# Patient Record
Sex: Male | Born: 1955 | Race: White | Hispanic: No | Marital: Married | Smoking: Never smoker
Health system: Southern US, Community
[De-identification: ages and names within clinical notes are randomized; demographics above are authoritative.]

## PROBLEM LIST (undated history)

## (undated) DIAGNOSIS — E785 Hyperlipidemia, unspecified: Secondary | ICD-10-CM

## (undated) DIAGNOSIS — B009 Herpesviral infection, unspecified: Secondary | ICD-10-CM

## (undated) DIAGNOSIS — I2699 Other pulmonary embolism without acute cor pulmonale: Secondary | ICD-10-CM

## (undated) DIAGNOSIS — F329 Major depressive disorder, single episode, unspecified: Secondary | ICD-10-CM

## (undated) DIAGNOSIS — I82409 Acute embolism and thrombosis of unspecified deep veins of unspecified lower extremity: Secondary | ICD-10-CM

## (undated) DIAGNOSIS — I10 Essential (primary) hypertension: Secondary | ICD-10-CM

## (undated) DIAGNOSIS — E079 Disorder of thyroid, unspecified: Secondary | ICD-10-CM

## (undated) DIAGNOSIS — F32A Depression, unspecified: Secondary | ICD-10-CM

## (undated) DIAGNOSIS — I219 Acute myocardial infarction, unspecified: Secondary | ICD-10-CM

## (undated) DIAGNOSIS — D689 Coagulation defect, unspecified: Secondary | ICD-10-CM

## (undated) DIAGNOSIS — I2510 Atherosclerotic heart disease of native coronary artery without angina pectoris: Secondary | ICD-10-CM

## (undated) HISTORY — DX: Depression, unspecified: F32.A

## (undated) HISTORY — DX: Other pulmonary embolism without acute cor pulmonale: I26.99

## (undated) HISTORY — DX: Disorder of thyroid, unspecified: E07.9

## (undated) HISTORY — DX: Coagulation defect, unspecified: D68.9

## (undated) HISTORY — PX: COSMETIC SURGERY: SHX468

## (undated) HISTORY — DX: Herpesviral infection, unspecified: B00.9

## (undated) HISTORY — DX: Essential (primary) hypertension: I10

## (undated) HISTORY — PX: VASECTOMY: SHX75

## (undated) HISTORY — DX: Hyperlipidemia, unspecified: E78.5

## (undated) HISTORY — DX: Acute myocardial infarction, unspecified: I21.9

## (undated) HISTORY — DX: Major depressive disorder, single episode, unspecified: F32.9

## (undated) HISTORY — DX: Acute embolism and thrombosis of unspecified deep veins of unspecified lower extremity: I82.409

## (undated) HISTORY — PX: THYROID SURGERY: SHX805

## (undated) HISTORY — PX: TONSILLECTOMY: SUR1361

## (undated) HISTORY — DX: Atherosclerotic heart disease of native coronary artery without angina pectoris: I25.10

---

## 1996-01-27 DIAGNOSIS — I2699 Other pulmonary embolism without acute cor pulmonale: Secondary | ICD-10-CM

## 1996-01-27 HISTORY — DX: Other pulmonary embolism without acute cor pulmonale: I26.99

## 1997-09-20 ENCOUNTER — Ambulatory Visit (HOSPITAL_COMMUNITY): Admission: RE | Admit: 1997-09-20 | Discharge: 1997-09-20 | Payer: Self-pay | Admitting: Internal Medicine

## 1997-09-20 ENCOUNTER — Inpatient Hospital Stay (HOSPITAL_COMMUNITY): Admission: EM | Admit: 1997-09-20 | Discharge: 1997-09-25 | Payer: Self-pay | Admitting: Internal Medicine

## 2001-08-30 ENCOUNTER — Other Ambulatory Visit: Admission: RE | Admit: 2001-08-30 | Discharge: 2001-08-30 | Payer: Self-pay | Admitting: Diagnostic Radiology

## 2001-10-10 ENCOUNTER — Encounter (INDEPENDENT_AMBULATORY_CARE_PROVIDER_SITE_OTHER): Payer: Self-pay

## 2001-10-10 ENCOUNTER — Observation Stay (HOSPITAL_COMMUNITY): Admission: RE | Admit: 2001-10-10 | Discharge: 2001-10-11 | Payer: Self-pay | Admitting: Surgery

## 2004-04-24 ENCOUNTER — Ambulatory Visit: Payer: Self-pay | Admitting: Psychology

## 2004-05-06 ENCOUNTER — Ambulatory Visit: Payer: Self-pay | Admitting: Psychology

## 2004-05-16 ENCOUNTER — Ambulatory Visit: Payer: Self-pay | Admitting: Psychology

## 2004-05-22 ENCOUNTER — Ambulatory Visit: Payer: Self-pay | Admitting: Psychology

## 2004-05-23 ENCOUNTER — Ambulatory Visit: Payer: Self-pay | Admitting: Psychology

## 2004-05-30 ENCOUNTER — Ambulatory Visit: Payer: Self-pay | Admitting: Psychology

## 2004-06-06 ENCOUNTER — Ambulatory Visit: Payer: Self-pay | Admitting: Psychology

## 2004-06-13 ENCOUNTER — Ambulatory Visit: Payer: Self-pay | Admitting: Psychology

## 2008-09-24 ENCOUNTER — Ambulatory Visit: Payer: Self-pay | Admitting: Vascular Surgery

## 2009-07-04 ENCOUNTER — Encounter: Admission: RE | Admit: 2009-07-04 | Discharge: 2009-07-04 | Payer: Self-pay | Admitting: Family Medicine

## 2010-06-10 NOTE — Procedures (Signed)
DUPLEX DEEP VENOUS EXAM - LOWER EXTREMITY   INDICATION:  Right lower extremity pain and swelling with recent plane  flight   HISTORY:  Edema:  Yes  Trauma/Surgery:  No  Pain:  Yes  PE:  Yes  Previous DVT:  Yes  Anticoagulants:  Other:   DUPLEX EXAM:                CFV   SFV   PopV  PTV    GSV                R  L  R  L  R  L  R   L  R  L  Thrombosis    o  o  o     o     +      o  Spontaneous   +  +  +     +     0      +  Phasic        +  +  +     +     0      +  Augmentation  +  +  +     +     0      +  Compressible  +  +  +     +     0      +  Competent     +  +  +     +     0      +   Legend:  + - yes  o - no  p - partial  D - decreased   IMPRESSION:  Acute deep venous thrombosis noted in the right posterior  tibial vein.   Notified Dr. Earl Lites with results.    _____________________________  Janetta Hora Fields, MD   MG/MEDQ  D:  09/24/2008  T:  09/24/2008  Job:  [id number]

## 2010-06-13 NOTE — Op Note (Signed)
NAME:  Roy Yang, Roy Yang                         ACCOUNT NO.:  [id number]   MEDICAL RECORD NO.:  [id number]                   PATIENT TYPE:  AMB   LOCATION:  DAY                                  FACILITY:  [facility]   PHYSICIAN:  Molli Hazard B. Daphine Deutscher, M.D.             DATE OF BIRTH:  [birth year]   DATE OF PROCEDURE:  10/10/2001  DATE OF DISCHARGE:                                 OPERATIVE REPORT   PREOPERATIVE DIAGNOSIS:  Left thyroid mass.   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   PROCEDURE:  Left thyroid lobectomy.   SURGEON:  Thornton Park. Daphine Deutscher, M.D.   ASSISTANT:  Rose Phi. Maple Hudson, M.D.   ANESTHESIA:  General endotracheal.   INDICATIONS:  The patient is Yang 55 year old gentleman with Yang palpable mass in  the left neck.  Informed consent was obtained regarding thyroid lobectomy  possible near total with risks not limited to recurrent laryngeal nerve  injury, parathyroid nerve explained to the patient.  The patient was taken  back to room 11, given general anesthesia.  He was placed in Yang sniffing  position with Yang roll between his shoulders and his neck hyperextended.  The  neck, chin, chest were prepped with Betadine and draped sterilely.  In  Langer's lines about 2 cm above the clavicles, Yang transverse incision was  made between the sternocleidomastoid and was divided.  Superior and inferior  flaps beneath the platysma were raised, and the [place] retractor was  placed.  The midline was divided, and the left strap muscles taken off the  thyroid.  The thyroid lobe was then immobilized first superiorly where we  stayed right on the thyroid and took the superior pole of vessels placing  clips and 2-0 silk ties where appropriate.  Then we went inferiorly,  immobilized the gland, taking down the middle thyroid vein and then  identified the recurrent laryngeal nerve and stayed well away from that.  I  stayed on the thyroid, and we  tediously dissected along clipping with little  hemoclips and tying where appropriate.  I then removed from below and  divided this sparing the parathyroids and the blood supply to the  parathyroids on the left side.  Again, we stayed away from the nerve in the  dissection removal.  Once removed, it was sent for frozen sections, and then  Dr. Laureen Ochs Yang call back report of probably benign follicular neoplasm but with  the ____________ he would have to put in much more for permanence.  In the  meantime, the wound was irrigated.  Bleeding and oozing were controlled with  Yang 4-0 Vicryl in one little area of oozing, and with Surgicel.  The strap  muscles were approximated with 4-0 Vicryl.  The wound was then closed with 4-  0 Vicryl proximally in the platysma and with 5-0 subcuticular Vicryl with  Benzoin and Steri-Strips.  The patient seemed to tolerate the procedure well  and was taken to the recovery room in satisfactory condition.                                               Thornton Park Daphine Deutscher, M.D.    MBM/MEDQ  D:  10/10/2001  T:  10/10/2001  Job:  [id number]   cc:   Brett Canales Yang. Cleta Alberts, M.D.

## 2010-06-25 ENCOUNTER — Inpatient Hospital Stay (HOSPITAL_COMMUNITY)
Admission: EM | Admit: 2010-06-25 | Discharge: 2010-06-28 | DRG: 853 | Disposition: A | Discharge status: Home / Self Care | Payer: BC Managed Care – PPO | Attending: Cardiology | Admitting: Cardiology

## 2010-06-25 ENCOUNTER — Emergency Department (HOSPITAL_COMMUNITY): Payer: BC Managed Care – PPO

## 2010-06-25 DIAGNOSIS — Z7902 Long term (current) use of antithrombotics/antiplatelets: Secondary | ICD-10-CM

## 2010-06-25 DIAGNOSIS — E7800 Pure hypercholesterolemia, unspecified: Secondary | ICD-10-CM | POA: Diagnosis present

## 2010-06-25 DIAGNOSIS — Z86718 Personal history of other venous thrombosis and embolism: Secondary | ICD-10-CM

## 2010-06-25 DIAGNOSIS — Z86711 Personal history of pulmonary embolism: Secondary | ICD-10-CM

## 2010-06-25 DIAGNOSIS — I2510 Atherosclerotic heart disease of native coronary artery without angina pectoris: Secondary | ICD-10-CM | POA: Diagnosis present

## 2010-06-25 DIAGNOSIS — I214 Non-ST elevation (NSTEMI) myocardial infarction: Principal | ICD-10-CM | POA: Diagnosis present

## 2010-06-25 DIAGNOSIS — Z7982 Long term (current) use of aspirin: Secondary | ICD-10-CM

## 2010-06-25 DIAGNOSIS — I2582 Chronic total occlusion of coronary artery: Secondary | ICD-10-CM | POA: Diagnosis present

## 2010-06-25 LAB — COMPREHENSIVE METABOLIC PANEL
Albumin: 3.5 g/dL (ref 3.5–5.2)
Alkaline Phosphatase: 58 U/L (ref 39–117)
Calcium: 8.6 mg/dL (ref 8.4–10.5)
Chloride: 102 mEq/L (ref 96–112)
Creatinine, Ser: 1.01 mg/dL (ref 0.4–1.5)
GFR calc Af Amer: >60 mL/min (ref >60)
Glucose, Bld: 101 mg/dL — ABNORMAL HIGH (ref 70–99)
Potassium: 4 mEq/L (ref 3.5–5.1)
Total Bilirubin: 0.8 mg/dL (ref 0.3–1.2)

## 2010-06-25 LAB — TROPONIN I: Troponin I: 1.49 ng/mL (ref <0.30)

## 2010-06-25 LAB — BASIC METABOLIC PANEL
BUN: 18 mg/dL (ref 6–23)
CO2: 27 mEq/L (ref 19–32)
Chloride: 103 mEq/L (ref 96–112)
Creatinine, Ser: 1.04 mg/dL (ref 0.4–1.5)
GFR calc Af Amer: >60 mL/min (ref >60)
GFR calc non Af Amer: >60 mL/min (ref >60)
Glucose, Bld: 95 mg/dL (ref 70–99)
Potassium: 4.1 mEq/L (ref 3.5–5.1)

## 2010-06-25 LAB — CK TOTAL AND CKMB (NOT AT ARMC): CK, MB: 22.5 ng/mL (ref 0.3–4.0)

## 2010-06-25 LAB — APTT: aPTT: 72 seconds — ABNORMAL HIGH (ref 24–37)

## 2010-06-25 LAB — URINALYSIS, ROUTINE W REFLEX MICROSCOPIC
Ketones, ur: 15 mg/dL — AB
Specific Gravity, Urine: 1.023 (ref 1.005–1.030)
Urobilinogen, UA: 0.2 mg/dL (ref 0.0–1.0)

## 2010-06-25 LAB — CBC
HCT: 45.9 % (ref 39.0–52.0)
MCHC: 35.1 g/dL (ref 30.0–36.0)
RDW: 12.7 % (ref 11.5–15.5)

## 2010-06-25 LAB — DIFFERENTIAL
Eosinophils Relative: 2 % (ref 0–5)
Lymphocytes Relative: 20 % (ref 12–46)

## 2010-06-25 LAB — PROTIME-INR: INR: 1.02 (ref 0.00–1.49)

## 2010-06-26 LAB — PLATELET INHIBITION P2Y12: Platelet Function Baseline: 240 [PRU] (ref 194–418)

## 2010-06-26 LAB — BASIC METABOLIC PANEL
BUN: 15 mg/dL (ref 6–23)
CO2: 26 mEq/L (ref 19–32)
Calcium: 8.4 mg/dL (ref 8.4–10.5)
GFR calc Af Amer: >60 mL/min (ref >60)
GFR calc non Af Amer: >60 mL/min (ref >60)
Potassium: 4 mEq/L (ref 3.5–5.1)
Sodium: 138 mEq/L (ref 135–145)

## 2010-06-26 LAB — CARDIAC PANEL(CRET KIN+CKTOT+MB+TROPI)
CK, MB: 52 ng/mL (ref 0.3–4.0)
CK, MB: 61.3 ng/mL (ref 0.3–4.0)
CK, MB: 54.5 ng/mL (ref 0.3–4.0)
Relative Index: 7.5 — ABNORMAL HIGH (ref 0.0–2.5)
Total CK: 698 U/L — ABNORMAL HIGH (ref 7–232)
Troponin I: 4.83 ng/mL (ref <0.30)
Troponin I: 16.97 ng/mL (ref <0.30)

## 2010-06-26 LAB — LIPID PANEL
Cholesterol: 188 mg/dL (ref 0–200)
HDL: 66 mg/dL (ref >39)
Total CHOL/HDL Ratio: 2.8 RATIO
Triglycerides: 82 mg/dL (ref <150)
VLDL: 16 mg/dL (ref 0–40)

## 2010-06-26 LAB — CBC
MCH: 34.1 pg — ABNORMAL HIGH (ref 26.0–34.0)
Platelets: 203 10*3/uL (ref 150–400)
RBC: 4.58 MIL/uL (ref 4.22–5.81)
WBC: 11.1 10*3/uL — ABNORMAL HIGH (ref 4.0–10.5)

## 2010-06-26 LAB — MRSA PCR SCREENING: MRSA by PCR: NEGATIVE

## 2010-06-26 LAB — POCT ACTIVATED CLOTTING TIME: Activated Clotting Time: 358 seconds

## 2010-06-27 LAB — CBC
Hemoglobin: 16.2 g/dL (ref 13.0–17.0)
MCH: 34.6 pg — ABNORMAL HIGH (ref 26.0–34.0)
MCHC: 34.5 g/dL (ref 30.0–36.0)
RBC: 4.68 MIL/uL (ref 4.22–5.81)
RDW: 12.9 % (ref 11.5–15.5)

## 2010-06-27 LAB — BASIC METABOLIC PANEL
BUN: 10 mg/dL (ref 6–23)
Calcium: 8.6 mg/dL (ref 8.4–10.5)
Chloride: 103 mEq/L (ref 96–112)
Creatinine, Ser: 1.2 mg/dL (ref 0.4–1.5)
GFR calc non Af Amer: >60 mL/min (ref >60)
Glucose, Bld: 97 mg/dL (ref 70–99)

## 2010-06-27 LAB — CARDIAC PANEL(CRET KIN+CKTOT+MB+TROPI): CK, MB: 22.5 ng/mL (ref 0.3–4.0)

## 2010-06-28 LAB — BASIC METABOLIC PANEL
CO2: 28 mEq/L (ref 19–32)
Calcium: 9 mg/dL (ref 8.4–10.5)
Chloride: 103 mEq/L (ref 96–112)
Glucose, Bld: 99 mg/dL (ref 70–99)
Sodium: 139 mEq/L (ref 135–145)

## 2010-06-28 LAB — CARDIAC PANEL(CRET KIN+CKTOT+MB+TROPI)
Total CK: 152 U/L (ref 7–232)
Troponin I: 3.63 ng/mL (ref <0.30)

## 2010-06-28 LAB — CBC
HCT: 48.1 % (ref 39.0–52.0)
MCH: 34 pg (ref 26.0–34.0)
MCHC: 33.9 g/dL (ref 30.0–36.0)
RDW: 12.7 % (ref 11.5–15.5)

## 2010-07-17 NOTE — Cardiovascular Report (Signed)
Roy Yang, Roy Yang               ACCOUNT NO.:  [id number]  MEDICAL RECORD NO.:  [id number]           PATIENT TYPE:  I  LOCATION:  2914                         FACILITY:  [facility]  PHYSICIAN:  Lenus Trauger N. Sharyn Lull, M.D. DATE OF BIRTH:  29-Jun-1955  DATE OF PROCEDURE:  06/26/2010 DATE OF DISCHARGE:                           CARDIAC CATHETERIZATION   PROCEDURES: 1. Left cardiac catheterization with selective left and right coronary     angiography, left ventriculography via right groin using Judkins     technique. 2. Successful percutaneous transluminal coronary angioplasty to     proximal ramus using 2.0 x 12 mm long MINI-TREK balloon. 3. Successful deployment of 2.5 x 18 mm long Resolute Integrity drug-     eluting stent in proximal ramus. 4. Successful postdilatation of the stent using 2.75 x 12 mm long [place]     TREK balloon.  OPERATOR:  Takera Rayl N. Sharyn Lull, MD  INDICATIONS FOR PROCEDURE:  Mr. Armor is a 55 year old white male with past medical history significant for DVT x2, history of pulmonary embolism in the past.  Workup for hypercoagulable state is negative in the past at [facility].  He came to the ER via EMS complaining of recurrent retrosternal chest tightness off and on this morning lasting few minutes to 10-15 minutes associated with diaphoresis, grade 3/10, localized. Denies any nausea, vomiting, diaphoresis initially, but around 3:00 p.m. again developed severe chest tightness, grade 6/10 lasting more than 30 minutes associated with diaphoresis.  [facility], received aspirin and sublingual nitro with partial relief and was started on IV nitro, heparin, aspirin, and Plavix with relief of chest pain in the ED.  Denies any palpitation, lightheadedness, or syncope.  Denies PND, orthopnea, or leg swelling.  Denies such episodes in the past.  PAST MEDICAL HISTORY:  As above.  PAST SURGICAL HISTORY:  He has partial thyroidectomy in the past, approximately 6  years ago.  SOCIAL HISTORY:  He is single and has 3 children.  No history of smoking.  Drinks occasionally socially.  He works as a International aid/development worker.  FAMILY HISTORY:  Father is alive.  He has atrial fibrillation.  Mother died of pancreatic cancer.  One brother also had atrial fibrillation and two sisters in good health.  PHYSICAL EXAMINATION:  GENERAL:  He is alert, awake, and oriented x3 in no acute distress. VITAL SIGNS:  Blood pressure was 126/70 and pulse was 54. HEENT:  Conjunctivae were pink. NECK:  Supple.  No JVD and no bruit. LUNGS:  Clear to auscultation without rhonchi or rales. CARDIOVASCULAR:  S1-S2 was normal.  There was soft systolic murmur and S4 gallop. ABDOMEN:  Soft.  Bowel sounds were present and nontender. EXTREMITIES:  There was no clubbing, cyanosis, or edema.  LABORATORY DATA:  First set of cardiac enzymes, they were elevated.  CPK was 337, MB 22.5, relative index 6.7, and troponin-I was 1.49.  EKG showed normal sinus rhythm with nonspecific T-wave changes.  Impression was acute non-Q-wave myocardial infarction, history of PE in the past, history of DVT in the past.  Discussed with the patient regarding left cath, possible PTCA and  stenting, its risks and benefits, i.e. death, MI, stroke, need for emergency CABG, risk of restenosis, local vascular complications, etc. and consented for PCI.  PROCEDURE IN DETAIL:  After obtaining the informed consent, the patient was brought to the cath lab and was placed on fluoroscopy table.  Right groin was prepped and draped in usual fashion.  Xylocaine 2% was used for local anesthesia in the right groin.  With the help of thin-wall needle, a 6-French arterial sheath was placed.  The sheath was aspirated and flushed.  Next, 6-French left Judkins catheter was advanced over the wire under fluoroscopic guidance up to the ascending aorta.  Wire was pulled out, the catheter was aspirated and connected to the manifold. Catheter  was further advanced and engaged into the left coronary ostium. Multiple views of the left system were taken.  Next, catheter was disengaged and was pulled out over the wire and was replaced with 6- [place] right Judkins catheter which was advanced over the wire under fluoroscopic guidance up to the ascending aorta.  Wire was pulled out, the catheter was aspirated and connected to the manifold.  Catheter was further advanced and engaged into the right coronary ostium.  Multiple views of the right system were taken.  Next, the catheter was disengaged and was pulled out over the wire and was placed with 6-French pigtail catheter which was advanced over the wire under fluoroscopic guidance up to the ascending aorta.  Wire was pulled out, the catheter was aspirated and connected to the manifold.  Catheter was further advanced across the aortic valve into the LV.  LV pressures were recorded.  Next, LV-graphy was done in 30-degree RAO position.  Post-angiographic pressures were recorded from LV and then pullback pressures were recorded from the aorta.  There was no gradient across the aortic valve.  Next, a pigtail catheter was pulled out over the wire.  Sheaths were aspirated and flushed.  FINDINGS:  LV showed good LV systolic function.  EF of 55-60%.  Left main was patent.  LAD has 5-10% proximal stenosis and 20-25% distal stenosis.  Diagonal-1 has 20-30% ostial stenosis.  Diagonal-2 is small which has 30% mid stenosis.  Ramus is 100% occluded at the ostium which is the culprit lesion for his non-Q-wave myocardial infarction.  Left circumflex has 15-20% proximal stenosis with haziness with TIMI grade 3 distal flow.  OM-1 and OM-2 were very very small.  OM-3 was very small which was patent.  OM-4 was moderate-sized which was patent.  RCA was large which was patent.  INTERVENTIONAL PROCEDURE:  Successful PTCA to proximal ramus was done using 2.0 x 12 mm long MINI-TREK balloon for predilatation  and then 2.5 x 18 mm long Resolute Integrity drug-eluting stent was deployed at 10 atmospheric pressure.  Stent was postdilated using 2.75 x 12 mm long [place] TREK balloon going up to 12-15 atmospheric pressure.  Lesion was dilated from 100% to 0% residual with excellent TIMI grade 3 distal flow without evidence of dissection or distal embolization.  The patient received weight-based Angiomax and 60 mg of Effient during the procedure.  The patient tolerated the procedure well.  There were no complications.  The patient was transferred to the recovery room in stable condition.     Eduardo Osier. Sharyn Lull, M.D.     MNH/MEDQ  D:  06/26/2010  T:  06/27/2010  Job:  [id number]  Electronically Signed by Rinaldo Cloud M.D. on 07/17/2010 09:43:02 AM

## 2010-07-17 NOTE — Discharge Summary (Signed)
NAMEDELON, REVELO               ACCOUNT NO.:  [id number]  MEDICAL RECORD NO.:  [id number]           PATIENT TYPE:  I  LOCATION:  4702                         FACILITY:  [facility]  PHYSICIAN:  Aniza Shor N. Sharyn Lull, M.D. DATE OF BIRTH:  01-09-1956  DATE OF ADMISSION:  06/25/2010 DATE OF DISCHARGE:  06/28/2010                              DISCHARGE SUMMARY   ADMITTING DIAGNOSES:  Acute non-Q-wave myocardial infarction, history of pulmonary embolism in the past, history of deep vein thrombosis in the past.  DISCHARGE DIAGNOSES:  Status post acute non-Q-wave myocardial infarction status post percutaneous transluminal coronary angioplasty stenting to 100% occluded ramus intermedius branch, hypercholesteremia, history of deep vein thrombosis, history of pulmonary embolism in the past.  DISCHARGE HOME MEDICATIONS: 1. Enteric-coated aspirin 325 mg 1 tablet daily. 2. Prasugrel 10 mg 1 tablet daily. 3. Crestor 20 mg 1 tablet daily. 4. Toprol-XL 25 mg 1 tablet daily. 5. Pepcid 20 mg 1 tablet twice daily. 6. Nitrostat 0.4 mg use as directed every 5 minutes as needed.  DIET:  Low salt, low cholesterol.  Post PTCA stent instructions have been given.  Condition at discharge is stable.  Follow up with me in 1 week.  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  Grade 3/10 chest tightness was localized.  Denies any nausea, vomiting, or diaphoresis initially, but had diaphoresis around 3:00 p.m. associated with progressive worsening tightness of grad 6/10 lasting 20-30 minutes.  He went to [facility], received aspirin and sublingual nitro with partial relief and was started on IV heparin, nitrates, aspirin,  Plavix with relief of chest pain in the ED.  The patient denies any palpitation, lightheadedness or syncope.  Denies PND, orthopnea or leg swelling. Denies such episode in the past.  The patient was noted to have elevated cardiac enzymes in the ER.  PAST MEDICAL HISTORY:  As above.  PAST SURGICAL HISTORY:  He had partial thyroidectomy 6 years ago.  SOCIAL HISTORY:  He is single, has 3 children.  No smoking.  Drinks occasionally socially.  He is International aid/development worker.  FAMILY HISTORY:  Father is alive.  He has chronic atrial fibrillation. Mother died of pancreatic cancer.  One brother had atrial fibrillation. Two sisters in good health.  PHYSICAL EXAMINATION:  GENERAL:  He is alert, awake and oriented x3 in no acute distress. VITAL SIGNS:  Blood pressure was 126/70, pulse was 54. HEENT:  Conjunctivae were pink. NECK:  Supple.  No JVD.  No bruit. LUNGS:  Clear to auscultation without rhonchi or rales. CARDIOVASCULAR:  S1 and S2 was normal.  There was soft systolic murmur and S4 gallop. ABDOMEN:  Soft.  Bowel sounds were present, nontender. EXTREMITIES:  There is no clubbing, cyanosis or edema.  LABORATORY FINDINGS:  Hemoglobin was 16.1, hematocrit 45.9, white count of 10.9.  His sodium was 137, potassium 4.1, glucose 95, BUN 18, creatinine  1.04.  His first set of CK was 337, MB of 22.5, relative index 6.7, first set troponin-I was 1.49.  Repeat CK on 31st, CK was 551, MB 52, relative index 9.4, troponin-I 4.83.  CK was 698, MB 61.3, relative index 8.8, troponin-I 16.97.  On May 31; CK 730, MB 54.5, relative index 7.5, troponin-I 13.21 which is trending down.  On June 1; CK 383, MB 22.5, relative index 5.9 and troponin I is trending down to 6.88.  Today, his CK is 152, MB 4.9, troponin-I is 3.63.  His hemoglobin today is 16.3, hematocrit 48.1, white count of 11.2, platelet count is 211,000.  His BUN is 17, creatinine 1.29, potassium is 4.1, glucose 99. His cholesterol was 188, HDL was 66,  LDL 106.  Triglycerides were 82. His platelet function P2Y12 PRU level was 237.  P2Y12 percent inhibition was 0 on Plavix.  Plavix was switched to prasugrel.  BRIEF HOSPITAL COURSE:  The patient was admitted to CCU.  The patient ruled in for non-Q-wave myocardial infarction.  The patient subsequently underwent on May 31 left cardiac cath with selective left and right coronary angiography and PTCA stenting to 100% occluded ramus.  As per procedure report, the patient tolerated procedure well.  There were no complications.  The patient did not had any further episodes of chest pain during the hospital stay.  His groin is stable with no evidence of hematoma or bruit.  Phase I cardiac rehab was called.  The patient has been ambulating in hallway without any problems.  The patient will be discharged home on above medications and will be scheduled for phase II cardiac rehab as outpatient.     Eduardo Osier. Sharyn Lull, M.D.     MNH/MEDQ  D:  06/28/2010  T:  06/28/2010  Job:  [id number]  Electronically Signed by Rinaldo Cloud M.D. on 07/17/2010 09:43:06 AM

## 2011-05-03 ENCOUNTER — Ambulatory Visit: Payer: BC Managed Care – PPO | Admitting: Emergency Medicine

## 2011-05-03 ENCOUNTER — Ambulatory Visit: Payer: BC Managed Care – PPO

## 2011-05-03 VITALS — BP 126/79 | HR 88 | Temp 98.5°F | Resp 16 | Ht 69.25 in | Wt 232.4 lb

## 2011-05-03 DIAGNOSIS — R6889 Other general symptoms and signs: Secondary | ICD-10-CM

## 2011-05-03 DIAGNOSIS — M109 Gout, unspecified: Secondary | ICD-10-CM

## 2011-05-03 DIAGNOSIS — M25539 Pain in unspecified wrist: Secondary | ICD-10-CM

## 2011-05-03 LAB — POCT CBC
Granulocyte percent: 88.5 %G — AB (ref 37–80)
HCT, POC: 46.9 % (ref 43.5–53.7)
MCHC: 33.9 g/dL (ref 31.8–35.4)
MPV: 9.1 fL (ref 0–99.8)
POC Granulocyte: 9.9 — AB (ref 2–6.9)
POC LYMPH PERCENT: 8.8 %L — AB (ref 10–50)
POC MID %: 2.7 %M (ref 0–12)
Platelet Count, POC: 263 10*3/uL (ref 142–424)
RDW, POC: 13.2 %

## 2011-05-03 LAB — URIC ACID: Uric Acid, Serum: 8.3 mg/dL — ABNORMAL HIGH (ref 4.0–7.8)

## 2011-05-03 MED ORDER — PREDNISONE 20 MG PO TABS: ORAL_TABLET | ORAL | Status: DC

## 2011-05-03 MED ORDER — HYDROCODONE-ACETAMINOPHEN 5-325 MG PO TABS: 1.0000 | ORAL_TABLET | ORAL | Status: AC | PRN

## 2011-05-03 NOTE — Patient Instructions (Signed)
Gout Gout is an inflammatory condition (arthritis) caused by a buildup of uric acid crystals in the joints. Uric acid is a chemical that is normally present in the blood. Under some circumstances, uric acid can form into crystals in your joints. This causes joint redness, soreness, and swelling (inflammation). Repeat attacks are common. Over time, uric acid crystals can form into masses (tophi) near a joint, causing disfigurement. Gout is treatable and often preventable. CAUSES  The disease begins with elevated levels of uric acid in the blood. Uric acid is produced by your body when it breaks down a naturally found substance called purines. This also happens when you eat certain foods such as meats and fish. Causes of an elevated uric acid level include:  Being passed down from parent to child (heredity).   Diseases that cause increased uric acid production (obesity, psoriasis, some cancers).   Excessive alcohol use.   Diet, especially diets rich in meat and seafood.   Medicines, including certain cancer-fighting drugs (chemotherapy), diuretics, and aspirin.   Chronic kidney disease. The kidneys are no longer able to remove uric acid well.   Problems with metabolism.  Conditions strongly associated with gout include:  Obesity.   High blood pressure.   High cholesterol.   Diabetes.  Not everyone with elevated uric acid levels gets gout. It is not understood why some people get gout and others do not. Surgery, joint injury, and eating too much of certain foods are some of the factors that can lead to gout. SYMPTOMS   An attack of gout comes on quickly. It causes intense pain with redness, swelling, and warmth in a joint.   Fever can occur.   Often, only one joint is involved. Certain joints are more commonly involved:   Base of the big toe.   Knee.   Ankle.   Wrist.   Finger.  Without treatment, an attack usually goes away in a few days to weeks. Between attacks, you  usually will not have symptoms, which is different from many other forms of arthritis. DIAGNOSIS  Your caregiver will suspect gout based on your symptoms and exam. Removal of fluid from the joint (arthrocentesis) is done to check for uric acid crystals. Your caregiver will give you a medicine that numbs the area (local anesthetic) and use a needle to remove joint fluid for exam. Gout is confirmed when uric acid crystals are seen in joint fluid, using a special microscope. Sometimes, blood, urine, and X-ray tests are also used. TREATMENT  There are 2 phases to gout treatment: treating the sudden onset (acute) attack and preventing attacks (prophylaxis). Treatment of an Acute Attack  Medicines are used. These include anti-inflammatory medicines or steroid medicines.   An injection of steroid medicine into the affected joint is sometimes necessary.   The painful joint is rested. Movement can worsen the arthritis.   You may use warm or cold treatments on painful joints, depending which works best for you.   Discuss the use of coffee, vitamin C, or cherries with your caregiver. These may be helpful treatment options.  Treatment to Prevent Attacks After the acute attack subsides, your caregiver may advise prophylactic medicine. These medicines either help your kidneys eliminate uric acid from your body or decrease your uric acid production. You may need to stay on these medicines for a very long time. The early phase of treatment with prophylactic medicine can be associated with an increase in acute gout attacks. For this reason, during the first few months   of treatment, your caregiver may also advise you to take medicines usually used for acute gout treatment. Be sure you understand your caregiver's directions. You should also discuss dietary treatment with your caregiver. Certain foods such as meats and fish can increase uric acid levels. Other foods such as dairy can decrease levels. Your caregiver  can give you a list of foods to avoid. HOME CARE INSTRUCTIONS   Do not take aspirin to relieve pain. This raises uric acid levels.   Only take over-the-counter or prescription medicines for pain, discomfort, or fever as directed by your caregiver.   Rest the joint as much as possible. When in bed, keep sheets and blankets off painful areas.   Keep the affected joint raised (elevated).   Use crutches if the painful joint is in your leg.   Drink enough water and fluids to keep your urine clear or pale yellow. This helps your body get rid of uric acid. Do not drink alcoholic beverages. They slow the passage of uric acid.   Follow your caregiver's dietary instructions. Pay careful attention to the amount of protein you eat. Your daily diet should emphasize fruits, vegetables, whole grains, and fat-free or low-fat milk products.   Maintain a healthy body weight.  SEEK MEDICAL CARE IF:   You have an oral temperature above 102 F (38.9 C).   You develop diarrhea, vomiting, or any side effects from medicines.   You do not feel better in 24 hours, or you are getting worse.  SEEK IMMEDIATE MEDICAL CARE IF:   Your joint becomes suddenly more tender and you have:   Chills.   An oral temperature above 102 F (38.9 C), not controlled by medicine.  MAKE SURE YOU:   Understand these instructions.   Will watch your condition.   Will get help right away if you are not doing well or get worse.  Document Released: 01/10/2000 Document Revised: 01/01/2011 Document Reviewed: 04/22/2009 [employer] Patient Information 2012 [employer]. 

## 2011-05-03 NOTE — Progress Notes (Signed)
  Subjective:    Patient ID: Roy Yang, male    DOB: 12-21-1955, 56 y.o.   MRN: [id number]  HPI is doing having severe pain and swelling in his left wrist. He has a history of gout. He's been very careful about his eating habits and has refrained from alcohol. He has no injury to his wrist.    Review of Systems patient overall doing well he has not any chest pain shortness of breath or any other problems.     Objective:   Physical Exam there is significant tenderness and swelling over the ulnar styloid. There is pain with any movement of the left wrist.  [facility] reading (PRIMARY) by  Dr. Royston Cowper shows a calcific density over the ulnar styloid.         Assessment & Plan:   I suspect this is a gout flare. We'll treat with prednisone Norco splint and check his uric acid level.

## 2011-05-07 ENCOUNTER — Telehealth: Payer: Self-pay

## 2011-05-07 NOTE — Telephone Encounter (Signed)
Advised pt of lab results.

## 2011-05-07 NOTE — Telephone Encounter (Signed)
PT STATES WE HAVE BEEN TRYING TO GET IN TOUCH WITH HIM REGARDING HIS LABS FOR 3 DAYS NOW. PLEASE CALL [phone]

## 2011-05-26 ENCOUNTER — Encounter: Payer: Self-pay | Admitting: Emergency Medicine

## 2011-05-26 ENCOUNTER — Ambulatory Visit (INDEPENDENT_AMBULATORY_CARE_PROVIDER_SITE_OTHER): Payer: BC Managed Care – PPO | Admitting: Emergency Medicine

## 2011-05-26 VITALS — BP 131/84 | HR 71 | Temp 98.6°F | Resp 16 | Ht 69.5 in | Wt 234.6 lb

## 2011-05-26 DIAGNOSIS — I2510 Atherosclerotic heart disease of native coronary artery without angina pectoris: Secondary | ICD-10-CM

## 2011-05-26 DIAGNOSIS — E785 Hyperlipidemia, unspecified: Secondary | ICD-10-CM

## 2011-05-26 DIAGNOSIS — I10 Essential (primary) hypertension: Secondary | ICD-10-CM

## 2011-05-26 DIAGNOSIS — M109 Gout, unspecified: Secondary | ICD-10-CM

## 2011-05-26 DIAGNOSIS — B009 Herpesviral infection, unspecified: Secondary | ICD-10-CM

## 2011-05-26 DIAGNOSIS — Z0000 Encounter for general adult medical examination without abnormal findings: Secondary | ICD-10-CM

## 2011-05-26 LAB — TSH: TSH: 0.906 u[IU]/mL (ref 0.350–4.500)

## 2011-05-26 LAB — COMPREHENSIVE METABOLIC PANEL
ALT: 33 U/L (ref 0–53)
AST: 23 U/L (ref 0–37)
Albumin: 4.5 g/dL (ref 3.5–5.2)
Alkaline Phosphatase: 64 U/L (ref 39–117)
BUN: 24 mg/dL — ABNORMAL HIGH (ref 6–23)
Calcium: 9.4 mg/dL (ref 8.4–10.5)
Chloride: 104 mEq/L (ref 96–112)
Potassium: 4.6 mEq/L (ref 3.5–5.3)

## 2011-05-26 LAB — CBC WITH DIFFERENTIAL/PLATELET
Basophils Absolute: 0 10*3/uL (ref 0.0–0.1)
Basophils Relative: 0 % (ref 0–1)
HCT: 47 % (ref 39.0–52.0)
Lymphocytes Relative: 11 % — ABNORMAL LOW (ref 12–46)
MCHC: 33.6 g/dL (ref 30.0–36.0)
Neutro Abs: 7.5 10*3/uL (ref 1.7–7.7)
Neutrophils Relative %: 85 % — ABNORMAL HIGH (ref 43–77)
Platelets: 225 10*3/uL (ref 150–400)
RDW: 13 % (ref 11.5–15.5)
WBC: 8.9 10*3/uL (ref 4.0–10.5)

## 2011-05-26 LAB — POCT UA - MICROSCOPIC ONLY
Crystals, Ur, HPF, POC: NEGATIVE
Yeast, UA: NEGATIVE

## 2011-05-26 LAB — POCT URINALYSIS DIPSTICK
Blood, UA: NEGATIVE
Glucose, UA: NEGATIVE
Nitrite, UA: NEGATIVE
Protein, UA: NEGATIVE
Spec Grav, UA: 1.02
Urobilinogen, UA: 0.2

## 2011-05-26 LAB — LIPID PANEL
HDL: 64 mg/dL (ref >39)
LDL Cholesterol: 123 mg/dL — ABNORMAL HIGH (ref 0–99)
Total CHOL/HDL Ratio: 3.1 Ratio

## 2011-05-26 LAB — PSA: PSA: 0.87 ng/mL (ref <=4.00)

## 2011-05-26 LAB — URIC ACID: Uric Acid, Serum: 8.3 mg/dL — ABNORMAL HIGH (ref 4.0–7.8)

## 2011-05-26 LAB — IFOBT (OCCULT BLOOD): IFOBT: NEGATIVE

## 2011-05-26 MED ORDER — ALLOPURINOL 100 MG PO TABS: ORAL_TABLET | ORAL | Status: DC

## 2011-05-26 NOTE — Progress Notes (Signed)
  Subjective:    Patient ID: Roy Yang, male    DOB: 02-Oct-1955, 56 y.o.   MRN: [id number]  HPI patient enters for his yearly checkup. Since his last checkup here patient presented with chest pain was found to have coronary artery disease. He's been under regular care with a cardiologist. He has developed recent problems with gout flares involving his wrists and and legs. He feels that some of his gout episodes or Tigan with his crest to the    Review of Systems  Constitutional: Negative.   HENT: Negative.   Eyes: Negative.   Respiratory: Negative.   Cardiovascular:       He has had no recent chest pain or shortness of breath he has known coronary artery disease  Genitourinary: Negative.   Musculoskeletal:       He has a history of gout and has had trouble with all his joints especially both hands. He also has had some intermittent pain in his elbows as well as numbness in his hands.  Neurological: Negative.   Hematological: Negative.   Psychiatric/Behavioral: Negative.        Objective:   Physical Exam  Constitutional: He appears well-developed and well-nourished.  HENT:  Head: Normocephalic and atraumatic.  Right Ear: External ear normal.  Left Ear: External ear normal.  Eyes: EOM are normal. Pupils are equal, round, and reactive to light.  Neck: No JVD present. No tracheal deviation present. No thyromegaly present.  Cardiovascular: Normal rate, regular rhythm, normal heart sounds and intact distal pulses.  Exam reveals no gallop and no friction rub.   No murmur heard. Pulmonary/Chest: Effort normal and breath sounds normal. He has no wheezes. He has no rales. He exhibits no tenderness.  Abdominal: Soft. He exhibits no distension. There is no tenderness. There is no rebound.  Genitourinary: Prostate normal. No penile tenderness.  Musculoskeletal:       He has deputrens contractures of both hands and some mild puffiness over his median nerves. Tenderness over the elbow.    Lymphadenopathy:    He has no cervical adenopathy.  Neurological: He is alert. He has normal reflexes.  Skin: Skin is warm and dry.          Assessment & Plan:  Patient stable from a coronary standpoint his main problem has been difficulty with gout. He feels is tied in with his cholesterol medication. Take the Crestor every other day and initiate allopurinol treatment with hopes that'll help his gout flares

## 2011-11-29 ENCOUNTER — Other Ambulatory Visit: Payer: Self-pay | Admitting: Emergency Medicine

## 2011-12-09 ENCOUNTER — Ambulatory Visit: Payer: BC Managed Care – PPO

## 2011-12-09 ENCOUNTER — Ambulatory Visit: Payer: BC Managed Care – PPO | Admitting: Emergency Medicine

## 2011-12-09 ENCOUNTER — Encounter: Payer: Self-pay | Admitting: Emergency Medicine

## 2011-12-09 VITALS — BP 114/86 | HR 73 | Temp 98.1°F | Resp 16 | Ht 70.5 in | Wt 240.0 lb

## 2011-12-09 DIAGNOSIS — M25531 Pain in right wrist: Secondary | ICD-10-CM

## 2011-12-09 DIAGNOSIS — M25539 Pain in unspecified wrist: Secondary | ICD-10-CM

## 2011-12-09 DIAGNOSIS — M109 Gout, unspecified: Secondary | ICD-10-CM

## 2011-12-09 LAB — URIC ACID: Uric Acid, Serum: 4.7 mg/dL (ref 4.0–7.8)

## 2011-12-09 LAB — COMPREHENSIVE METABOLIC PANEL
ALT: 37 U/L (ref 0–53)
Albumin: 4 g/dL (ref 3.5–5.2)
CO2: 25 mEq/L (ref 19–32)
Chloride: 104 mEq/L (ref 96–112)
Potassium: 4.5 mEq/L (ref 3.5–5.3)
Sodium: 137 mEq/L (ref 135–145)
Total Bilirubin: 0.4 mg/dL (ref 0.3–1.2)
Total Protein: 6.1 g/dL (ref 6.0–8.3)

## 2011-12-09 LAB — POCT CBC
Granulocyte percent: 86.3 %G — AB (ref 37–80)
HCT, POC: 49.6 % (ref 43.5–53.7)
Hemoglobin: 16 g/dL (ref 14.1–18.1)
MCV: 99.6 fL — AB (ref 80–97)
POC Granulocyte: 11 — AB (ref 2–6.9)
RBC: 4.98 M/uL (ref 4.69–6.13)

## 2011-12-09 LAB — POCT SEDIMENTATION RATE: POCT SED RATE: 4 mm/hr (ref 0–22)

## 2011-12-09 MED ORDER — PREDNISONE 20 MG PO TABS: ORAL_TABLET | ORAL | Status: DC

## 2011-12-09 MED ORDER — HYDROCODONE-ACETAMINOPHEN 5-325 MG PO TABS: 1.0000 | ORAL_TABLET | Freq: Four times a day (QID) | ORAL | Status: DC | PRN

## 2011-12-09 NOTE — Progress Notes (Signed)
  Subjective:    Patient ID: Roy Yang, male    DOB: 1955/02/08, 56 y.o.   MRN: [id number]  HPI patient enters with a history of gout. He's been taking steroids and anti-inflammatories off and on over the past few weeks because of an area of swelling and pain lateral right wrist.    Review of Systems     Objective:   Physical Exam there is significant redness and swelling over the ulnar styloid. There is significant pain on flexion extension of the right wrist.  [facility] reading (PRIMARY) by  Dr. 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         Assessment & Plan:    9 day taper dose of prednisone 20 mg. He is also given medication for pain. He is to consider  referral to a rheumatologist. Maryclare Labrador go ahead and make a referral to the orthopedist regarding his abnormal wrist film.

## 2011-12-09 NOTE — Patient Instructions (Addendum)
Gout Gout is an inflammatory condition (arthritis) caused by a buildup of uric acid crystals in the joints. Uric acid is a chemical that is normally present in the blood. Under some circumstances, uric acid can form into crystals in your joints. This causes joint redness, soreness, and swelling (inflammation). Repeat attacks are common. Over time, uric acid crystals can form into masses (tophi) near a joint, causing disfigurement. Gout is treatable and often preventable. CAUSES  The disease begins with elevated levels of uric acid in the blood. Uric acid is produced by your body when it breaks down a naturally found substance called purines. This also happens when you eat certain foods such as meats and fish. Causes of an elevated uric acid level include:  Being passed down from parent to child (heredity).  Diseases that cause increased uric acid production (obesity, psoriasis, some cancers).  Excessive alcohol use.  Diet, especially diets rich in meat and seafood.  Medicines, including certain cancer-fighting drugs (chemotherapy), diuretics, and aspirin.  Chronic kidney disease. The kidneys are no longer able to remove uric acid well.  Problems with metabolism. Conditions strongly associated with gout include:  Obesity.  High blood pressure.  High cholesterol.  Diabetes. Not everyone with elevated uric acid levels gets gout. It is not understood why some people get gout and others do not. Surgery, joint injury, and eating too much of certain foods are some of the factors that can lead to gout. SYMPTOMS   An attack of gout comes on quickly. It causes intense pain with redness, swelling, and warmth in a joint.  Fever can occur.  Often, only one joint is involved. Certain joints are more commonly involved:  Base of the big toe.  Knee.  Ankle.  Wrist.  Finger. Without treatment, an attack usually goes away in a few days to weeks. Between attacks, you usually will not have  symptoms, which is different from many other forms of arthritis. DIAGNOSIS  Your caregiver will suspect gout based on your symptoms and exam. Removal of fluid from the joint (arthrocentesis) is done to check for uric acid crystals. Your caregiver will give you a medicine that numbs the area (local anesthetic) and use a needle to remove joint fluid for exam. Gout is confirmed when uric acid crystals are seen in joint fluid, using a special microscope. Sometimes, blood, urine, and X-ray tests are also used. TREATMENT  There are 2 phases to gout treatment: treating the sudden onset (acute) attack and preventing attacks (prophylaxis). Treatment of an Acute Attack  Medicines are used. These include anti-inflammatory medicines or steroid medicines.  An injection of steroid medicine into the affected joint is sometimes necessary.  The painful joint is rested. Movement can worsen the arthritis.  You may use warm or cold treatments on painful joints, depending which works best for you.  Discuss the use of coffee, vitamin C, or cherries with your caregiver. These may be helpful treatment options. Treatment to Prevent Attacks After the acute attack subsides, your caregiver may advise prophylactic medicine. These medicines either help your kidneys eliminate uric acid from your body or decrease your uric acid production. You may need to stay on these medicines for a very long time. The early phase of treatment with prophylactic medicine can be associated with an increase in acute gout attacks. For this reason, during the first few months of treatment, your caregiver may also advise you to take medicines usually used for acute gout treatment. Be sure you understand your caregiver's directions.   You should also discuss dietary treatment with your caregiver. Certain foods such as meats and fish can increase uric acid levels. Other foods such as dairy can decrease levels. Your caregiver can give you a list of foods  to avoid. HOME CARE INSTRUCTIONS   Do not take aspirin to relieve pain. This raises uric acid levels.  Only take over-the-counter or prescription medicines for pain, discomfort, or fever as directed by your caregiver.  Rest the joint as much as possible. When in bed, keep sheets and blankets off painful areas.  Keep the affected joint raised (elevated).  Use crutches if the painful joint is in your leg.  Drink enough water and fluids to keep your urine clear or pale yellow. This helps your body get rid of uric acid. Do not drink alcoholic beverages. They slow the passage of uric acid.  Follow your caregiver's dietary instructions. Pay careful attention to the amount of protein you eat. Your daily diet should emphasize fruits, vegetables, whole grains, and fat-free or low-fat milk products.  Maintain a healthy body weight. SEEK MEDICAL CARE IF:   You have an oral temperature above 102 F (38.9 C).  You develop diarrhea, vomiting, or any side effects from medicines.  You do not feel better in 24 hours, or you are getting worse. SEEK IMMEDIATE MEDICAL CARE IF:   Your joint becomes suddenly more tender and you have:  Chills.  An oral temperature above 102 F (38.9 C), not controlled by medicine. MAKE SURE YOU:   Understand these instructions.  Will watch your condition.  Will get help right away if you are not doing well or get worse. Document Released: 01/10/2000 Document Revised: 04/06/2011 Document Reviewed: 04/22/2009 [employer] Patient Information 2013 [employer].    

## 2011-12-15 ENCOUNTER — Other Ambulatory Visit: Payer: Self-pay | Admitting: Emergency Medicine

## 2011-12-15 ENCOUNTER — Telehealth: Payer: Self-pay | Admitting: Emergency Medicine

## 2011-12-15 DIAGNOSIS — M1990 Unspecified osteoarthritis, unspecified site: Secondary | ICD-10-CM

## 2011-12-18 NOTE — Telephone Encounter (Signed)
Please check with referrals to make sure the appointment has been made with Dr. 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

## 2011-12-18 NOTE — Telephone Encounter (Signed)
Dr Cleta Alberts, it looks like pt has an appt scheduled w/Dr Dierdre Forth on 12/22/11 at 9 am.

## 2012-03-01 ENCOUNTER — Other Ambulatory Visit: Payer: Self-pay | Admitting: Emergency Medicine

## 2012-04-29 ENCOUNTER — Ambulatory Visit (INDEPENDENT_AMBULATORY_CARE_PROVIDER_SITE_OTHER): Payer: BC Managed Care – PPO | Admitting: Emergency Medicine

## 2012-04-29 VITALS — BP 140/92 | HR 73 | Temp 98.0°F | Resp 16 | Ht 69.5 in | Wt 240.0 lb

## 2012-04-29 DIAGNOSIS — I2510 Atherosclerotic heart disease of native coronary artery without angina pectoris: Secondary | ICD-10-CM

## 2012-04-29 DIAGNOSIS — Z0000 Encounter for general adult medical examination without abnormal findings: Secondary | ICD-10-CM

## 2012-04-29 DIAGNOSIS — E785 Hyperlipidemia, unspecified: Secondary | ICD-10-CM

## 2012-04-29 DIAGNOSIS — I10 Essential (primary) hypertension: Secondary | ICD-10-CM

## 2012-04-29 DIAGNOSIS — R768 Other specified abnormal immunological findings in serum: Secondary | ICD-10-CM

## 2012-04-29 DIAGNOSIS — M109 Gout, unspecified: Secondary | ICD-10-CM

## 2012-04-29 DIAGNOSIS — N529 Male erectile dysfunction, unspecified: Secondary | ICD-10-CM

## 2012-04-29 DIAGNOSIS — R894 Abnormal immunological findings in specimens from other organs, systems and tissues: Secondary | ICD-10-CM

## 2012-04-29 LAB — CBC WITH DIFFERENTIAL/PLATELET
Basophils Absolute: 0 10*3/uL (ref 0.0–0.1)
Eosinophils Absolute: 0.2 10*3/uL (ref 0.0–0.7)
Eosinophils Relative: 3 % (ref 0–5)
MCH: 32.8 pg (ref 26.0–34.0)
MCV: 94.9 fL (ref 78.0–100.0)
Platelets: 188 10*3/uL (ref 150–400)
RDW: 13.5 % (ref 11.5–15.5)
WBC: 6.1 10*3/uL (ref 4.0–10.5)

## 2012-04-29 LAB — COMPREHENSIVE METABOLIC PANEL
ALT: 26 U/L (ref 0–53)
AST: 22 U/L (ref 0–37)
Alkaline Phosphatase: 59 U/L (ref 39–117)
Creat: 1.04 mg/dL (ref 0.50–1.35)
Total Bilirubin: 0.6 mg/dL (ref 0.3–1.2)

## 2012-04-29 LAB — POCT UA - MICROSCOPIC ONLY
Bacteria, U Microscopic: NEGATIVE
Casts, Ur, LPF, POC: NEGATIVE
Crystals, Ur, HPF, POC: NEGATIVE
Mucus, UA: NEGATIVE

## 2012-04-29 LAB — POCT URINALYSIS DIPSTICK
Bilirubin, UA: NEGATIVE
Blood, UA: NEGATIVE
Nitrite, UA: NEGATIVE
Protein, UA: NEGATIVE
pH, UA: 5.5

## 2012-04-29 LAB — TSH: TSH: 2.483 u[IU]/mL (ref 0.350–4.500)

## 2012-04-29 LAB — PSA: PSA: 0.97 ng/mL (ref <=4.00)

## 2012-04-29 LAB — LIPID PANEL
Total CHOL/HDL Ratio: 2.4 Ratio
VLDL: 11 mg/dL (ref 0–40)

## 2012-04-29 MED ORDER — VALACYCLOVIR HCL 1 G PO TABS: 1000.0000 mg | ORAL_TABLET | Freq: Two times a day (BID) | ORAL | Status: DC

## 2012-04-29 MED ORDER — TADALAFIL 20 MG PO TABS: ORAL_TABLET | ORAL | Status: DC

## 2012-04-29 NOTE — Progress Notes (Signed)
@[email or web address] @  Patient ID: Roy Yang MRN: [id number], DOB: Aug 21, 1955 57 y.o. Date of Encounter: 04/29/2012, 8:48 AM  Primary Physician: No primary provider on file.  Chief Complaint: Physical (CPE)  HPI: 57 y.o. y/o male with history noted below here for CPE.  Doing well. No issues/complaints.  Review of Systems: Consitutional: No fever, chills, fatigue, night sweats, lymphadenopathy, or weight changes. Eyes: No visual changes, eye redness, or discharge. ENT/Mouth: Ears: No otalgia, tinnitus, hearing loss, discharge. Nose: No congestion, rhinorrhea, sinus pain, or epistaxis. Throat: No sore throat, post nasal drip, or teeth pain. Cardiovascular: No CP, palpitations, diaphoresis, DOE, edema, orthopnea, PND. Respiratory: No cough, hemoptysis, SOB, or wheezing. Gastrointestinal: No anorexia, dysphagia, reflux, pain, nausea, vomiting, hematemesis, diarrhea, constipation, BRBPR, or melena. Genitourinary: No dysuria, frequency, urgency, hematuria, incontinence, nocturia, decreased urinary stream, discharge, impotence, or testicular pain/masses. Musculoskeletal: No decreased ROM, myalgias, stiffness, joint swelling, or weakness. Skin: No rash, erythema, lesion changes, pain, warmth, jaundice, or pruritis. Neurological: No headache, dizziness, syncope, seizures, tremors, memory loss, coordination problems, or paresthesias. Psychological: No anxiety, depression, hallucinations, SI/HI. Endocrine: No fatigue, polydipsia, polyphagia, polyuria, or known diabetes. All other systems were reviewed and are otherwise negative.  Past Medical History  Diagnosis Date  . CAD (coronary artery disease)   . Gout   . HSV-2 (herpes simplex virus 2) infection   . Hyperlipidemia   . Thyroid disease   . Clotting disorder      Past Surgical History  Procedure Laterality Date  . Vasectomy      Home Meds:  Prior to Admission medications   Medication Sig Start Date End Date Taking? Authorizing Provider   allopurinol (ZYLOPRIM) 100 MG tablet Take one tablet daily for the first 2 weeks then one twice a day 05/26/11   [name], MD  aspirin 325 MG tablet Take 325 mg by mouth daily.    Historical Provider, MD  HYDROcodone-acetaminophen (NORCO) 5-325 MG per tablet Take 1 tablet by mouth every 6 (six) hours as needed for pain. 12/09/11   [name], MD  metoprolol tartrate (LOPRESSOR) 25 MG tablet Take 25 mg by mouth 2 (two) times daily.    Historical Provider, MD  predniSONE (DELTASONE) 20 MG tablet Take 3 a day for 3 days 2 a day for 3 days one a day for 3 days 12/09/11   [name], MD  rosuvastatin (CRESTOR) 20 MG tablet Take 20 mg by mouth daily.    Historical Provider, MD  valACYclovir (VALTREX) 1000 MG tablet TAKE AS DIRECTED 03/01/12   Nelva Nay, PA-C    Allergies: No Known Allergies  History   Social History  . Marital Status: Single    Spouse Name: N/A    Number of Children: N/A  . Years of Education: N/A   Occupational History  . Not on file.   Social History Main Topics  . Smoking status: Never Smoker   . Smokeless tobacco: Not on file  . Alcohol Use: Not on file  . Drug Use: Not on file  . Sexually Active: Not on file   Other Topics Concern  . Not on file   Social History Narrative  . No narrative on file    No family history on file.  Physical Exam: There were no vitals taken for this visit.  General: Well developed, well nourished, in no acute distress. HEENT: Normocephalic, atraumatic. Conjunctiva pink, sclera non-icteric. Pupils 2 mm constricting to 1 mm, round, regular, and equally reactive to light and  accomodation. EOMI. Internal auditory canal clear. TMs with good cone of light and without pathology. Nasal mucosa pink. Nares are without discharge. No sinus tenderness. Oral mucosa pink. Dentition. Pharynx without exudate.   Neck: Supple. Trachea midline. No thyromegaly. Full ROM. No lymphadenopathy. Lungs: Clear to auscultation bilaterally  without wheezes, rales, or rhonchi. Breathing is of normal effort and unlabored. Cardiovascular: RRR with S1 S2. No murmurs, rubs, or gallops appreciated. Distal pulses 2+ symmetrically. No carotid or abdominal bruits. Abdomen: Soft, non-tender, non-distended with normoactive bowel sounds. No hepatosplenomegaly or masses. No rebound/guarding. No CVA tenderness. Without hernias.  Rectal: No external hemorrhoids or fissures. Rectal vault without masses.  Genitourinary:  circumcised male. No penile lesions. Testes descended bilaterally, and smooth without tenderness or masses.  Musculoskeletal: Full range of motion and 5/5 strength throughout. Without swelling, atrophy, tenderness, crepitus, or warmth. Extremities without clubbing, cyanosis, or edema. Calves supple. Skin: Warm and moist without erythema, ecchymosis, wounds, or rash. Neuro: A+Ox3. CN II-XII grossly intact. Moves all extremities spontaneously. Full sensation throughout. Normal gait. DTR 2+ throughout upper and lower extremities. Finger to nose intact. Psych:  Responds to questions appropriately with a normal affect.   Results for orders placed in visit on 04/29/12  POCT URINALYSIS DIPSTICK      Result Value Range   Color, UA yellow     Clarity, UA clear     Glucose, UA neg     Bilirubin, UA neg     Ketones, UA neg     Spec Grav, UA 1.020     Blood, UA neg     pH, UA 5.5     Protein, UA neg     Urobilinogen, UA 0.2     Nitrite, UA neg     Leukocytes, UA Negative    POCT UA - MICROSCOPIC ONLY      Result Value Range   WBC, Ur, HPF, POC neg     RBC, urine, microscopic neg     Bacteria, U Microscopic neg     Mucus, UA neg     Epithelial cells, urine per micros neg     Crystals, Ur, HPF, POC neg     Casts, Ur, LPF, POC neg     Yeast, UA neg     Studies: CBC, CMET, Lipid, PSA, TSH, Uric Acid  all pending.    Assessment/Plan:  57 y.o. y/o  male here for CPE patient is stable at the present time he has recently developed a  relationship with a woman from [place] and she is now living with him and they plan to be married and going to try Cialis for ED issues he has a discomfort in his rectal area and he is going to schedule his own colonoscopy with Dr. Marjorie Smolder. Refer to urology if his PSA is rising. -  Signed, Earl Lites, MD 04/29/2012 8:48 AM   .

## 2012-07-02 ENCOUNTER — Ambulatory Visit: Payer: BC Managed Care – PPO | Admitting: Emergency Medicine

## 2012-07-02 ENCOUNTER — Ambulatory Visit: Payer: BC Managed Care – PPO

## 2012-07-02 VITALS — BP 124/81 | HR 66 | Temp 98.0°F | Resp 17 | Ht 70.5 in | Wt 239.0 lb

## 2012-07-02 DIAGNOSIS — R002 Palpitations: Secondary | ICD-10-CM

## 2012-07-02 DIAGNOSIS — M25569 Pain in unspecified knee: Secondary | ICD-10-CM

## 2012-07-02 DIAGNOSIS — M25562 Pain in left knee: Secondary | ICD-10-CM

## 2012-07-02 NOTE — Patient Instructions (Signed)
Palpitations  A palpitation is the feeling that your heartbeat is irregular or is faster than normal. It may feel like your heart is fluttering or skipping a beat. Palpitations are usually not a serious problem. However, in some cases, you may need further medical evaluation. CAUSES  Palpitations can be caused by:  Smoking.  Caffeine or other stimulants, such as diet pills or energy drinks.  Alcohol.  Stress and anxiety.  Strenuous physical activity.  Fatigue.  Certain medicines.  Heart disease, especially if you have a history of arrhythmias. This includes atrial fibrillation, atrial flutter, or supraventricular tachycardia.  An improperly working pacemaker or defibrillator. DIAGNOSIS  To find the cause of your palpitations, your caregiver will take your history and perform a physical exam. Tests may also be done, including:  Electrocardiography (ECG). This test records the heart's electrical activity.  Cardiac monitoring. This allows your caregiver to monitor your heart rate and rhythm in real time.  Holter monitor. This is a portable device that records your heartbeat and can help diagnose heart arrhythmias. It allows your caregiver to track your heart activity for several days, if needed.  Stress tests by exercise or by giving medicine that makes the heart beat faster. TREATMENT  Treatment of palpitations depends on the cause of your symptoms and can vary greatly. Most cases of palpitations do not require any treatment other than time, relaxation, and monitoring your symptoms. Other causes, such as atrial fibrillation, atrial flutter, or supraventricular tachycardia, usually require further treatment. HOME CARE INSTRUCTIONS   Avoid:  Caffeinated coffee, tea, soft drinks, diet pills, and energy drinks.  Chocolate.  Alcohol.  Stop smoking if you smoke.  Reduce your stress and anxiety. Things that can help you relax include:  A method that measures bodily functions so  you can learn to control them (biofeedback).  Yoga.  Meditation.  Physical activity such as swimming, jogging, or walking.  Get plenty of rest and sleep. SEEK MEDICAL CARE IF:   You continue to have a fast or irregular heartbeat beyond 24 hours.  Your palpitations occur more often. SEEK IMMEDIATE MEDICAL CARE IF:  You develop chest pain or shortness of breath.  You have a severe headache.  You feel dizzy, or you faint. MAKE SURE YOU:  Understand these instructions.  Will watch your condition.  Will get help right away if you are not doing well or get worse. Document Released: 01/10/2000 Document Revised: 07/14/2011 Document Reviewed: 03/13/2011 [facility] Patient Information 2014 [place], [place]. Knee Pain The knee is the complex joint between your thigh and your lower leg. It is made up of bones, tendons, ligaments, and cartilage. The bones that make up the knee are:  The femur in the thigh.  The tibia and fibula in the lower leg.  The patella or kneecap riding in the groove on the lower femur. CAUSES  Knee pain is a common complaint with many causes. A few of these causes are:  Injury, such as:  A ruptured ligament or tendon injury.  Torn cartilage.  Medical conditions, such as:  Gout  Arthritis  Infections  Overuse, over training or overdoing a physical activity. Knee pain can be minor or severe. Knee pain can accompany debilitating injury. Minor knee problems often respond well to self-care measures or get well on their own. More serious injuries may need medical intervention or even surgery. SYMPTOMS The knee is complex. Symptoms of knee problems can vary widely. Some of the problems are:  Pain with movement and weight bearing.  Swelling and tenderness.  Buckling of the knee.  Inability to straighten or extend your knee.  Your knee locks and you cannot straighten it.  Warmth and redness with pain and fever.  Deformity or dislocation of the  kneecap. DIAGNOSIS  Determining what is wrong may be very straight forward such as when there is an injury. It can also be challenging because of the complexity of the knee. Tests to make a diagnosis may include:  Your caregiver taking a history and doing a physical exam.  Routine X-rays can be used to rule out other problems. X-rays will not reveal a cartilage tear. Some injuries of the knee can be diagnosed by:  Arthroscopy a surgical technique by which a small video camera is inserted through tiny incisions on the sides of the knee. This procedure is used to examine and repair internal knee joint problems. Tiny instruments can be used during arthroscopy to repair the torn knee cartilage (meniscus).  Arthrography is a radiology technique. A contrast liquid is directly injected into the knee joint. Internal structures of the knee joint then become visible on X-ray film.  An MRI scan is a non x-ray radiology procedure in which magnetic fields and a computer produce two- or three-dimensional images of the inside of the knee. Cartilage tears are often visible using an MRI scanner. MRI scans have largely replaced arthrography in diagnosing cartilage tears of the knee.  Blood work.  Examination of the fluid that helps to lubricate the knee joint (synovial fluid). This is done by taking a sample out using a needle and a syringe. TREATMENT The treatment of knee problems depends on the cause. Some of these treatments are:  Depending on the injury, proper casting, splinting, surgery or physical therapy care will be needed.  Give yourself adequate recovery time. Do not overuse your joints. If you begin to get sore during workout routines, back off. Slow down or do fewer repetitions.  For repetitive activities such as cycling or running, maintain your strength and nutrition.  Alternate muscle groups. For example if you are a weight lifter, work the upper body on one day and the lower body the  next.  Either tight or weak muscles do not give the proper support for your knee. Tight or weak muscles do not absorb the stress placed on the knee joint. Keep the muscles surrounding the knee strong.  Take care of mechanical problems.  If you have flat feet, orthotics or special shoes may help. See your caregiver if you need help.  Arch supports, sometimes with wedges on the inner or outer aspect of the heel, can help. These can shift pressure away from the side of the knee most bothered by osteoarthritis.  A brace called an "unloader" brace also may be used to help ease the pressure on the most arthritic side of the knee.  If your caregiver has prescribed crutches, braces, wraps or ice, use as directed. The acronym for this is PRICE. This means protection, rest, ice, compression and elevation.  Nonsteroidal anti-inflammatory drugs (NSAID's), can help relieve pain. But if taken immediately after an injury, they may actually increase swelling. Take NSAID's with food in your stomach. Stop them if you develop stomach problems. Do not take these if you have a history of ulcers, stomach pain or bleeding from the bowel. Do not take without your caregiver's approval if you have problems with fluid retention, heart failure, or kidney problems.  For ongoing knee problems, physical therapy may be helpful.  Glucosamine and chondroitin are over-the-counter dietary supplements. Both may help relieve the pain of osteoarthritis in the knee. These medicines are different from the usual anti-inflammatory drugs. Glucosamine may decrease the rate of cartilage destruction.  Injections of a corticosteroid drug into your knee joint may help reduce the symptoms of an arthritis flare-up. They may provide pain relief that lasts a few months. You may have to wait a few months between injections. The injections do have a small increased risk of infection, water retention and elevated blood sugar levels.  Hyaluronic acid  injected into damaged joints may ease pain and provide lubrication. These injections may work by reducing inflammation. A series of shots may give relief for as long as 6 months.  Topical painkillers. Applying certain ointments to your skin may help relieve the pain and stiffness of osteoarthritis. Ask your pharmacist for suggestions. Many over the-counter products are approved for temporary relief of arthritis pain.  In some countries, doctors often prescribe topical NSAID's for relief of chronic conditions such as arthritis and tendinitis. A review of treatment with NSAID creams found that they worked as well as oral medications but without the serious side effects. PREVENTION  Maintain a healthy weight. Extra pounds put more strain on your joints.  Get strong, stay limber. Weak muscles are a common cause of knee injuries. Stretching is important. Include flexibility exercises in your workouts.  Be smart about exercise. If you have osteoarthritis, chronic knee pain or recurring injuries, you may need to change the way you exercise. This does not mean you have to stop being active. If your knees ache after jogging or playing basketball, consider switching to swimming, water aerobics or other low-impact activities, at least for a few days a week. Sometimes limiting high-impact activities will provide relief.  Make sure your shoes fit well. Choose footwear that is right for your sport.  Protect your knees. Use the proper gear for knee-sensitive activities. Use kneepads when playing volleyball or laying carpet. Buckle your seat belt every time you drive. Most shattered kneecaps occur in car accidents.  Rest when you are tired. SEEK MEDICAL CARE IF:  You have knee pain that is continual and does not seem to be getting better.  SEEK IMMEDIATE MEDICAL CARE IF:  Your knee joint feels hot to the touch and you have a high fever. MAKE SURE YOU:   Understand these instructions.  Will watch your  condition.  Will get help right away if you are not doing well or get worse. Document Released: 11/09/2006 Document Revised: 04/06/2011 Document Reviewed: 11/09/2006 [facility] Patient Information 2014 [place], [place].

## 2012-07-02 NOTE — Progress Notes (Signed)
  Subjective:    Patient ID: Roy Yang, male    DOB: 1955/04/10, 57 y.o.   MRN: [id number]  HPI with 2 complaints. #1 he recently has had pain in the posterior portion of his left knee. He does have a history of gout. He denies injury to this knee. His knee has not been unstable and has not locked up. Problem #2 patient awoke this morning and felt like his heart was beating fast and very irregular. He has no previous history of atrial fibrillation. He does have a history of coronary disease and is currently on medical treatment for this. He is status post placement of stent    Review of Systems     Objective:   Physical Exam patient is alert and cooperative and in no distress. His chest is clear to auscultation and percussion. His cardiac exam shows a regular rate no murmurs. Abdomen is soft nontender. There is tenderness posterior medial meniscus. There is negative McMurray there is no joint effusion there is no tenderness over the calf there is a negative anterior drawer appeared EKG shows sinus bradycardia mild interventricular conduction delay no acute changes [facility] reading (PRIMARY) by  Dr. Cleta Alberts normal        Assessment & Plan:  We'll check an EKG and films of the left knee. He probably will need to get in and see Dr. Katrinka Blazing to see if he needs a Holter monitor. I discussed the case with Dr Forest Gleason and they will contact the patient

## 2012-10-22 ENCOUNTER — Other Ambulatory Visit: Payer: Self-pay | Admitting: Interventional Cardiology

## 2012-10-22 DIAGNOSIS — Z79899 Other long term (current) drug therapy: Secondary | ICD-10-CM

## 2012-10-22 DIAGNOSIS — E785 Hyperlipidemia, unspecified: Secondary | ICD-10-CM

## 2012-11-06 ENCOUNTER — Other Ambulatory Visit: Payer: Self-pay | Admitting: Interventional Cardiology

## 2012-11-28 ENCOUNTER — Other Ambulatory Visit (INDEPENDENT_AMBULATORY_CARE_PROVIDER_SITE_OTHER): Payer: BC Managed Care – PPO

## 2012-11-28 DIAGNOSIS — Z79899 Other long term (current) drug therapy: Secondary | ICD-10-CM

## 2012-11-28 DIAGNOSIS — E785 Hyperlipidemia, unspecified: Secondary | ICD-10-CM

## 2012-11-28 LAB — LIPID PANEL
HDL: 52.3 mg/dL (ref >39.00)
LDL Cholesterol: 55 mg/dL (ref 0–99)
Total CHOL/HDL Ratio: 2
Triglycerides: 111 mg/dL (ref 0.0–149.0)
VLDL: 22.2 mg/dL (ref 0.0–40.0)

## 2012-11-28 LAB — ALT: ALT: 32 U/L (ref 0–53)

## 2012-11-30 ENCOUNTER — Telehealth: Payer: Self-pay

## 2012-11-30 NOTE — Telephone Encounter (Signed)
lmom pt labs are ok

## 2012-11-30 NOTE — Telephone Encounter (Signed)
Message copied by Jarvis Newcomer on Wed Nov 30, 2012  9:32 AM ------      Message from: Verdis Prime      Created: Wed Nov 30, 2012  8:57 AM       Excellent. ------

## 2012-12-01 ENCOUNTER — Other Ambulatory Visit: Payer: Self-pay

## 2012-12-12 NOTE — Telephone Encounter (Signed)
Message copied by Jarvis Newcomer on Mon Dec 12, 2012 10:09 AM ------      Message from: Verdis Prime      Created: Wed Nov 30, 2012  8:57 AM       Excellent. ------

## 2013-02-08 ENCOUNTER — Telehealth: Payer: Self-pay

## 2013-02-08 MED ORDER — ROSUVASTATIN CALCIUM 20 MG PO TABS: ORAL_TABLET | ORAL | Status: DC

## 2013-02-08 NOTE — Telephone Encounter (Signed)
Done

## 2013-02-09 ENCOUNTER — Telehealth: Payer: Self-pay

## 2013-02-09 MED ORDER — METOPROLOL SUCCINATE ER 25 MG PO TB24: 25.0000 mg | ORAL_TABLET | ORAL | Status: DC

## 2013-02-09 NOTE — Telephone Encounter (Signed)
called pt to schedula an appt.pt past due has nor been seen since 06/2011 appt made for 02/17/13 @8 :15. pt aware he needs to keep appt to receive additonal refills.

## 2013-02-15 ENCOUNTER — Encounter: Payer: Self-pay | Admitting: *Deleted

## 2013-02-17 ENCOUNTER — Ambulatory Visit (INDEPENDENT_AMBULATORY_CARE_PROVIDER_SITE_OTHER): Payer: BC Managed Care – PPO | Admitting: Interventional Cardiology

## 2013-02-17 ENCOUNTER — Encounter: Payer: Self-pay | Admitting: Interventional Cardiology

## 2013-02-17 VITALS — BP 152/87 | HR 78 | Ht 70.0 in | Wt 251.0 lb

## 2013-02-17 DIAGNOSIS — I10 Essential (primary) hypertension: Secondary | ICD-10-CM

## 2013-02-17 DIAGNOSIS — I2510 Atherosclerotic heart disease of native coronary artery without angina pectoris: Secondary | ICD-10-CM

## 2013-02-17 DIAGNOSIS — E785 Hyperlipidemia, unspecified: Secondary | ICD-10-CM

## 2013-02-17 MED ORDER — ASPIRIN EC 81 MG PO TBEC: 81.0000 mg | DELAYED_RELEASE_TABLET | Freq: Every day | ORAL | Status: DC

## 2013-02-17 NOTE — Patient Instructions (Signed)
Start Aspirin (coated) 81mg  daily  Take all other medications as prescribed  Your physician recommends that you return for a FASTING lipid profile and alt: November 2015  Your physician wants you to follow-up in: 1 year You will receive a reminder letter in the mail two months in advance. If you don't receive a letter, please call our office to schedule the follow-up appointment.

## 2013-02-17 NOTE — Progress Notes (Signed)
Patient ID: Roy Yang, male   DOB: 06/20/1955, 58 y.o.   MRN: [id number] Current Medications  Nitroglycerin 0.4 mg 0.4 mg tablet 1 tablet as directed as directed prn chest pain  Prozac 20 mg Capsule 1 capsule in the morning Once a day  L-Lysine 1000 MG Tablet 1 tablet once a day  Fish Oil 1000 MG Capsule 1 capsule with a meal Once a day  Crestor 40 mg Tablet 1 tablet every other day  Aspirin EC 325 MG Tablet Delayed Release 1 tablet once a day  Toprol XL 25 MG Tablet Extended Release 24 Hour 1 tablet Once a day  Allopurinol 100 MG Tablet 1 tablet Once a day  Medication List reviewed and reconciled with the patient   Past Medical History  Recurrent DVT  Pulmonary embolism 1998  CAD with NSTEMI and Ramus DES May, 2012  Hyeprlipidemia  Gout   Social History  General:  History of smoking  cigarettes: Never smoked no Smoking.  no Tobacco Exposure.  no Exercise.  Occupation: employed, International aid/development worker.  Education: yes.  Marital Status: single, Separated.  Children: 2, son (s), 1, daughter (s).    Allergies  [facility]      [address] [place], [place]  [address] Phone: [phone] Fax:  [phone]  Date:  02/17/2013   ID:  Roy Yang, DOB [birth year], MRN [id number]  PCP:  No PCP Per Patient   ASSESSMENT:  1. Coronary artery disease, asymptomatic 2. Hyperlipidemia with excellent profile 2 months ago 3. Obesity, controlled  PLAN:  1. Active lifestyle with aerobic activity is encouraged 2. Repeat lipid profile one year 3. Office visit in one year 4. Institute aspirin 81 mg daily (coated)   SUBJECTIVE: Roy Yang is a 58 y.o. male who is very active. He is status post ramus intermedius DES in 2012. He has had no recurrent angina. He has a Psychologist, educational and experiences no cardiovascular limitations. No medication side effects. He is on rosuvastatin, metoprolol, and allopurinol.   Wt Readings from Last 3 Encounters:  07/02/12 239 lb (108.41 kg)    04/29/12 240 lb (108.863 kg)  12/09/11 240 lb (108.863 kg)     Past Medical History  Diagnosis Date  . CAD (coronary artery disease)   . Gout   . HSV-2 (herpes simplex virus 2) infection   . Hyperlipidemia   . Thyroid disease   . Clotting disorder   . DVT, lower extremity, recurrent   . PE (pulmonary embolism) 1998    Current Outpatient Prescriptions  Medication Sig Dispense Refill  . allopurinol (ZYLOPRIM) 100 MG tablet Take one tablet daily for the first 2 weeks then one twice a day  60 tablet  11  . HYDROcodone-acetaminophen (NORCO) 5-325 MG per tablet Take 1 tablet by mouth every 6 (six) hours as needed for pain.  30 tablet  0  . metoprolol succinate (TOPROL-XL) 25 MG 24 hr tablet Take 1 tablet (25 mg total) by mouth every other day.  15 tablet  0  . predniSONE (DELTASONE) 20 MG tablet Take 3 a day for 3 days 2 a day for 3 days one a day for 3 days  18 tablet  0  . rosuvastatin (CRESTOR) 20 MG tablet Take 1 tablet by mouth every other day  15 tablet  1  . tadalafil (CIALIS) 20 MG tablet Take one half tablet one to 2 hours prior to intercourse. Medication can be taken every 48 hours as needed  5  tablet  11  . valACYclovir (VALTREX) 1000 MG tablet Take 1 tablet (1,000 mg total) by mouth 2 (two) times daily.  30 tablet  2   No current facility-administered medications for this visit.    Allergies:   No Known Allergies  Social History:  The patient  reports that he has never smoked. He does not have any smokeless tobacco history on file. He reports that he does not drink alcohol or use illicit drugs.   ROS:  Please see the history of present illness.   Denies claudication, no transient neurological symptoms, no peripheral edema, orthopnea, PND, prolonged palpitations, or syncope.   All other systems reviewed and negative.   OBJECTIVE: VS:  There were no vitals taken for this visit. Well nourished, well developed, in no acute distress, obese HEENT: normal Neck: JVD flat.  Carotid bruit absent  Cardiac:  normal S1, S2; RRR; no murmur Lungs:  clear to auscultation bilaterally, no wheezing, rhonchi or rales Abd: soft, nontender, no hepatomegaly Ext: Edema none. Pulses 2+ Skin: warm and dry Neuro:  CNs 2-12 intact, no focal abnormalities noted  EKG:  Left axis deviation otherwise unremarkable.       Signed, Darci NeedleHenry W. B. Marlo Arriola III, MD 02/17/2013 8:21 AM

## 2013-03-08 ENCOUNTER — Other Ambulatory Visit: Payer: Self-pay

## 2013-03-08 MED ORDER — METOPROLOL SUCCINATE ER 25 MG PO TB24: 25.0000 mg | ORAL_TABLET | ORAL | Status: DC

## 2013-03-17 ENCOUNTER — Other Ambulatory Visit: Payer: Self-pay | Admitting: Gastroenterology

## 2013-04-07 ENCOUNTER — Other Ambulatory Visit: Payer: Self-pay

## 2013-04-07 MED ORDER — ROSUVASTATIN CALCIUM 20 MG PO TABS: ORAL_TABLET | ORAL | Status: DC

## 2013-04-25 ENCOUNTER — Encounter: Payer: Self-pay | Admitting: Emergency Medicine

## 2013-05-09 ENCOUNTER — Other Ambulatory Visit: Payer: Self-pay | Admitting: *Deleted

## 2013-05-09 MED ORDER — ROSUVASTATIN CALCIUM 20 MG PO TABS: ORAL_TABLET | ORAL | Status: DC

## 2013-08-04 ENCOUNTER — Other Ambulatory Visit: Payer: Self-pay | Admitting: Emergency Medicine

## 2013-08-12 ENCOUNTER — Other Ambulatory Visit: Payer: Self-pay | Admitting: Emergency Medicine

## 2013-08-14 ENCOUNTER — Ambulatory Visit (INDEPENDENT_AMBULATORY_CARE_PROVIDER_SITE_OTHER): Payer: BC Managed Care – PPO | Admitting: Emergency Medicine

## 2013-08-14 VITALS — BP 128/86 | HR 82 | Temp 98.6°F | Resp 16 | Ht 69.25 in | Wt 229.6 lb

## 2013-08-14 DIAGNOSIS — F32A Depression, unspecified: Secondary | ICD-10-CM

## 2013-08-14 DIAGNOSIS — R7989 Other specified abnormal findings of blood chemistry: Secondary | ICD-10-CM

## 2013-08-14 DIAGNOSIS — F329 Major depressive disorder, single episode, unspecified: Secondary | ICD-10-CM

## 2013-08-14 DIAGNOSIS — I2510 Atherosclerotic heart disease of native coronary artery without angina pectoris: Secondary | ICD-10-CM

## 2013-08-14 DIAGNOSIS — F3289 Other specified depressive episodes: Secondary | ICD-10-CM

## 2013-08-14 DIAGNOSIS — Z0000 Encounter for general adult medical examination without abnormal findings: Secondary | ICD-10-CM

## 2013-08-14 DIAGNOSIS — E291 Testicular hypofunction: Secondary | ICD-10-CM

## 2013-08-14 LAB — TSH: TSH: 2.854 u[IU]/mL (ref 0.350–4.500)

## 2013-08-14 LAB — POCT URINALYSIS DIPSTICK
Bilirubin, UA: NEGATIVE
Blood, UA: NEGATIVE
GLUCOSE UA: NEGATIVE
Ketones, UA: NEGATIVE
Leukocytes, UA: NEGATIVE
NITRITE UA: NEGATIVE
PH UA: 6
Protein, UA: NEGATIVE
Spec Grav, UA: <=1.005
UROBILINOGEN UA: 0.2

## 2013-08-14 LAB — COMPREHENSIVE METABOLIC PANEL
ALT: 29 U/L (ref 0–53)
AST: 28 U/L (ref 0–37)
Albumin: 4.2 g/dL (ref 3.5–5.2)
Alkaline Phosphatase: 69 U/L (ref 39–117)
BUN: 21 mg/dL (ref 6–23)
CALCIUM: 9.3 mg/dL (ref 8.4–10.5)
CHLORIDE: 101 meq/L (ref 96–112)
CO2: 29 mEq/L (ref 19–32)
CREATININE: 1.33 mg/dL (ref 0.50–1.35)
Glucose, Bld: 100 mg/dL — ABNORMAL HIGH (ref 70–99)
Potassium: 4.6 mEq/L (ref 3.5–5.3)
SODIUM: 138 meq/L (ref 135–145)
Total Bilirubin: 0.6 mg/dL (ref 0.2–1.2)
Total Protein: 6.6 g/dL (ref 6.0–8.3)

## 2013-08-14 LAB — POCT UA - MICROSCOPIC ONLY
Bacteria, U Microscopic: NEGATIVE
Casts, Ur, LPF, POC: NEGATIVE
Crystals, Ur, HPF, POC: NEGATIVE
Mucus, UA: NEGATIVE
RBC, URINE, MICROSCOPIC: NEGATIVE
YEAST UA: NEGATIVE

## 2013-08-14 LAB — LIPID PANEL
Cholesterol: 164 mg/dL (ref 0–200)
HDL: 51 mg/dL (ref >39)
LDL CALC: 102 mg/dL — AB (ref 0–99)
Total CHOL/HDL Ratio: 3.2 Ratio
Triglycerides: 57 mg/dL (ref <150)
VLDL: 11 mg/dL (ref 0–40)

## 2013-08-14 LAB — IFOBT (OCCULT BLOOD): IMMUNOLOGICAL FECAL OCCULT BLOOD TEST: NEGATIVE

## 2013-08-14 MED ORDER — VALACYCLOVIR HCL 1 G PO TABS: 1000.0000 mg | ORAL_TABLET | Freq: Two times a day (BID) | ORAL | Status: DC

## 2013-08-14 MED ORDER — TADALAFIL 20 MG PO TABS: ORAL_TABLET | ORAL | Status: DC

## 2013-08-14 NOTE — Progress Notes (Addendum)
Subjective:  This chart was scribed for Roy ChrisSteven Jaslen Adcox, MD by Carl Bestelina Holson, Medical Scribe. This patient was seen in Room 10 and the patient's care was started at 8:45 AM.   Patient ID: Roy Yang, male    DOB: 08/01/1955, 58 y.o.   MRN: [id number]  HPI HPI Comments: Roy Yang is a 58 y.o. male with a history of CAD, hypertension, and hyperlipidemia who presents to the [facility] for an annual exam.  The patient states that he had his TDAP before he went to [place] and he has had his shingles vaccination.  The patient states that he does not normally receive flu shots. He states that he takes one baby aspirin daily and has a history of one stent placement.  He states that he sees his cardiologist once a year.  He states that his last colonoscopy was in January.   The patient states that he one child in [place], one in [place], and another in [place].  He states that he ended a relationship.  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    The patient states that his mother had pancreatic cancer and his father had intestinal carcinoma.     Past Medical History  Diagnosis Date  . CAD (coronary artery disease)   . Gout   . HSV-2 (herpes simplex virus 2) infection   . Hyperlipidemia   . Thyroid disease   . Clotting disorder   . DVT, lower extremity, recurrent   . PE (pulmonary embolism) 1998   Past Surgical History  Procedure Laterality Date  . Vasectomy     History reviewed. No pertinent family history. History   Social History  . Marital Status: Single    Spouse Name: N/A    Number of Children: N/A  . Years of Education: N/A   Occupational History  . Not on file.   Social History Main Topics  . Smoking status: Never Smoker   . Smokeless tobacco: Not on file  . Alcohol Use: 2.0 - 2.5 oz/week    4-5 drink(s) per week  . Drug Use: No  . Sexual Activity: Yes    Birth Control/ Protection: Abstinence   Other  Topics Concern  . Not on file   Social History Narrative  . No narrative on file   No Known Allergies  Review of Systems  All other systems reviewed and are negative.    Objective:  Physical Exam  Nursing note and vitals reviewed. Constitutional: He is oriented to person, place, and time. He appears well-developed and well-nourished.  HENT:  Head: Normocephalic and atraumatic.  Right Ear: External ear normal.  Left Ear: External ear normal.  Nose: Nose normal.  Mouth/Throat: Oropharynx is clear and moist. No oropharyngeal exudate.  Eyes: Conjunctivae and EOM are normal. Pupils are equal, round, and reactive to light. Right eye exhibits no discharge. Left eye exhibits no discharge. No scleral icterus.  Neck: Normal range of motion. Neck supple. No JVD present. No tracheal deviation present. No thyromegaly present.  Cardiovascular: Normal rate, regular rhythm, normal heart sounds and intact distal pulses.  Exam reveals no gallop and no friction rub.   No murmur heard. Pulmonary/Chest: Effort normal and breath sounds normal. No stridor. No respiratory distress. He has no wheezes. He has no rales.  Genitourinary: Prostate normal.  Musculoskeletal: Normal range of motion. He exhibits no edema and no tenderness.  Lymphadenopathy:    He has no cervical  adenopathy.  Neurological: He is alert and oriented to person, place, and time. He has normal reflexes. He displays normal reflexes. No cranial nerve deficit. He exhibits normal muscle tone. Coordination normal.  Skin: Skin is warm and dry. No rash noted.  Bilateral varicose veins on his legs.  Psychiatric: He has a normal mood and affect. His behavior is normal.   Results for orders placed in visit on 08/14/13  POCT URINALYSIS DIPSTICK      Result Value Ref Range   Color, UA yellow     Clarity, UA clear     Glucose, UA neg     Bilirubin, UA neg     Ketones, UA neg     Spec Grav, UA <=1.005     Blood, UA neg     pH, UA 6.0      Protein, UA neg     Urobilinogen, UA 0.2     Nitrite, UA neg     Leukocytes, UA Negative    POCT UA - MICROSCOPIC ONLY      Result Value Ref Range   WBC, Ur, HPF, POC 0-1     RBC, urine, microscopic neg     Bacteria, U Microscopic neg     Mucus, UA neg     Epithelial cells, urine per micros 0-1     Crystals, Ur, HPF, POC neg     Casts, Ur, LPF, POC neg     Yeast, UA neg    IFOBT (OCCULT BLOOD)      Result Value Ref Range   IFOBT Negative     Meds ordered this encounter  Medications  . tadalafil (CIALIS) 20 MG tablet    Sig: TAKE 1/2 TABLET 1-2 HOURS PRIOR TO INTERCOURSE. MAY BE TAKEN EVERY 48 HOURS AS NEEDED    Dispense:  6 tablet    Refill:  11  . valACYclovir (VALTREX) 1000 MG tablet    Sig: Take 1 tablet (1,000 mg total) by mouth 2 (two) times daily.    Dispense:  30 tablet    Refill:  11    BP 128/86  Pulse 82  Temp(Src) 98.6 F (37 C) (Oral)  Resp 16  Ht 5' 9.25" (1.759 m)  Wt 229 lb 9.6 oz (104.146 kg)  BMI 33.66 kg/m2  SpO2 95% Assessment & Plan:  Patient has been giving himself testosterone injections he receives from [place]. I told him with his history of DVTs and previous pulmonary emboli that we would consider this a contraindication to hormone replacement. I have made him an appointment to see Dr. Everardo All to discuss this with him. All of his routine labs were done. I told him my opinion was taking testosterone shots was dangerous for him.  I personally performed the services described in this documentation, which was scribed in my presence. The recorded information has been reviewed and is accurate.

## 2013-08-15 LAB — TESTOSTERONE, FREE, TOTAL, SHBG
Sex Hormone Binding: 16 nmol/L (ref 13–71)
TESTOSTERONE: 1060.97 ng/dL — AB (ref 300–890)
Testosterone, Free: 345.1 pg/mL — ABNORMAL HIGH (ref 47.0–244.0)
Testosterone-% Free: 3.3 % — ABNORMAL HIGH (ref 1.6–2.9)

## 2013-08-15 LAB — PSA: PSA: 1.37 ng/mL (ref <=4.00)

## 2013-08-18 ENCOUNTER — Telehealth: Payer: Self-pay

## 2013-08-18 NOTE — Telephone Encounter (Signed)
PA needed for Cialis. LMOM for pt to CB to give hx of other meds he has tried for ED. Started PA form on covermymeds and saved for completion after talking w/pt.

## 2013-09-12 ENCOUNTER — Ambulatory Visit (INDEPENDENT_AMBULATORY_CARE_PROVIDER_SITE_OTHER): Payer: BC Managed Care – PPO | Admitting: Endocrinology

## 2013-09-12 ENCOUNTER — Encounter: Payer: Self-pay | Admitting: Endocrinology

## 2013-09-12 VITALS — BP 116/78 | HR 68 | Temp 98.0°F | Ht 69.25 in | Wt 227.0 lb

## 2013-09-12 DIAGNOSIS — E291 Testicular hypofunction: Secondary | ICD-10-CM

## 2013-09-12 LAB — FOLLICLE STIMULATING HORMONE: FSH: 0.9 m[IU]/mL — ABNORMAL LOW (ref 1.4–18.1)

## 2013-09-12 LAB — TESTOSTERONE: Testosterone: 135.5 ng/dL — ABNORMAL LOW (ref 300.00–890.00)

## 2013-09-12 LAB — LUTEINIZING HORMONE: LH: 0.58 m[IU]/mL — AB (ref 1.50–9.30)

## 2013-09-12 NOTE — Patient Instructions (Signed)
blood tests are being requested for you today.  We'll contact you with results. Based on the results, i would be happy to normalize your testosterone with "clomiphene."

## 2013-09-12 NOTE — Progress Notes (Signed)
Subjective:    Patient ID: Roy Yang, male    DOB: 10/10/1955, 58 y.o.   MRN: [id number]  HPI Pt reports he had puberty at the normal age.  He has 3 biological children.  He denies any h/o infertility.   He was noted in late 2014 to have slightly decreased strength of all 4 extremities, and assoc depression.  He was found to have a testosterone of 157.  He was rx'ed testosterone injections (125 mg twice a week), hCG, and sermorelin (GHRH).  He quickly felt better.  However, he recently saw dr Cleta Albertsdaub, whe noted his h/o DVT, PE, and CAD, and advised discontinuation of these products.  He stopped all 3, 1 month ago.   Past Medical History  Diagnosis Date  . CAD (coronary artery disease)   . Gout   . HSV-2 (herpes simplex virus 2) infection   . Hyperlipidemia   . Thyroid disease   . Clotting disorder   . DVT, lower extremity, recurrent   . PE (pulmonary embolism) 1998    Past Surgical History  Procedure Laterality Date  . Vasectomy      History   Social History  . Marital Status: Single    Spouse Name: N/A    Number of Children: N/A  . Years of Education: N/A   Occupational History  . Not on file.   Social History Main Topics  . Smoking status: Never Smoker   . Smokeless tobacco: Not on file  . Alcohol Use: 2.0 - 2.5 oz/week    4-5 drink(s) per week  . Drug Use: No  . Sexual Activity: Yes    Birth Control/ Protection: Abstinence   Other Topics Concern  . Not on file   Social History Narrative  . No narrative on file    Current Outpatient Prescriptions on File Prior to Visit  Medication Sig Dispense Refill  . allopurinol (ZYLOPRIM) 300 MG tablet Take 300 mg by mouth daily.      Marland Kitchen. aspirin EC 81 MG tablet Take 1 tablet (81 mg total) by mouth daily.      . metoprolol succinate (TOPROL-XL) 25 MG 24 hr tablet Take 1 tablet (25 mg total) by mouth every other day.  30 tablet  12  . valACYclovir (VALTREX) 1000 MG tablet Take 1 tablet (1,000 mg total) by mouth 2 (two)  times daily.  30 tablet  11  . rosuvastatin (CRESTOR) 20 MG tablet Take 1 tablet by mouth every other day  15 tablet  5  . tadalafil (CIALIS) 20 MG tablet TAKE 1/2 TABLET 1-2 HOURS PRIOR TO INTERCOURSE. MAY BE TAKEN EVERY 48 HOURS AS NEEDED  6 tablet  11   No current facility-administered medications on file prior to visit.    No Known Allergies  No family history on file.  BP 116/78  Pulse 68  Temp(Src) 98 F (36.7 C) (Oral)  Ht 5' 9.25" (1.759 m)  Wt 227 lb (102.967 kg)  BMI 33.28 kg/m2  SpO2 97%  Review of Systems denies polyuria, loss of smell, cold intolerance, syncope, rash, insomnia, headache, visual loss, gynecomastia, easy bruising, change in facial appearance, rhinorrhea, and n/v.  He has lost a few lbs, due to his efforts.     Objective:   Physical Exam VS: see vs page GEN: no distress HEAD: head: no deformity eyes: no periorbital swelling, no proptosis external nose and ears are normal mouth: no lesion seen NECK: a healed scar is present.  i do not appreciate  a nodule in the thyroid or elsewhere in the neck CHEST WALL: no deformity.   LUNGS: clear to auscultation BREASTS:  No gynecomastia CV: reg rate and rhythm, no murmur ABD: abdomen is soft, nontender.  no hepatosplenomegaly.  not distended.  no hernia GENITALIA:  Normal male.   MUSCULOSKELETAL: muscle bulk and strength are grossly normal.  no obvious joint swelling.  gait is normal and steady.  EXTEMITIES: no deformity.  no edema.   PULSES: no carotid bruit.   NEURO:  cn 2-12 grossly intact.   readily moves all 4's.  sensation is intact to touch on all 4's.   SKIN:  Normal texture and temperature.  No rash or suspicious lesion is visible.  Normal hair distribution. NODES:  None palpable at the neck.   PSYCH: alert, well-oriented.  Does not appear anxious nor depressed.     Lab Results  Component Value Date   TESTOSTERONE 135.50* 09/12/2013   i have reviewed the following old records: Office  notes.  Head CT (2011): no mention is made of any pituitary abnormality.    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 CAD, new to me: in this context, he'll need improvement in testosterone only to the lower limit of normal. Weight loss: this helps the testosterone level--he is encouraged to continue.     Patient is advised the following: Patient Instructions  blood tests are being requested for you today.  We'll contact you with results. Based on the results, i would be happy to normalize your testosterone with "clomiphene."

## 2013-09-13 ENCOUNTER — Encounter: Payer: Self-pay | Admitting: Endocrinology

## 2013-09-13 ENCOUNTER — Other Ambulatory Visit: Payer: Self-pay

## 2013-09-13 LAB — PROLACTIN: Prolactin: 6.1 ng/mL (ref 2.1–17.1)

## 2013-09-13 MED ORDER — CLOMIPHENE CITRATE 50 MG PO TABS: ORAL_TABLET | ORAL | Status: DC

## 2013-10-04 ENCOUNTER — Other Ambulatory Visit: Payer: Self-pay | Admitting: Endocrinology

## 2013-10-04 ENCOUNTER — Encounter: Payer: Self-pay | Admitting: Endocrinology

## 2013-10-04 DIAGNOSIS — E291 Testicular hypofunction: Secondary | ICD-10-CM

## 2013-10-06 ENCOUNTER — Ambulatory Visit (INDEPENDENT_AMBULATORY_CARE_PROVIDER_SITE_OTHER): Payer: BC Managed Care – PPO

## 2013-10-06 DIAGNOSIS — E291 Testicular hypofunction: Secondary | ICD-10-CM

## 2013-10-06 LAB — TESTOSTERONE: Testosterone: 255.42 ng/dL — ABNORMAL LOW (ref 300.00–890.00)

## 2013-10-24 NOTE — Telephone Encounter (Signed)
PA approved through 01/25/2038. Notified pharmacy. 

## 2013-11-06 ENCOUNTER — Ambulatory Visit: Payer: BC Managed Care – PPO

## 2013-11-15 ENCOUNTER — Other Ambulatory Visit (INDEPENDENT_AMBULATORY_CARE_PROVIDER_SITE_OTHER): Payer: BC Managed Care – PPO

## 2013-11-15 ENCOUNTER — Telehealth: Payer: Self-pay

## 2013-11-15 DIAGNOSIS — Z86718 Personal history of other venous thrombosis and embolism: Secondary | ICD-10-CM

## 2013-11-15 DIAGNOSIS — E291 Testicular hypofunction: Secondary | ICD-10-CM

## 2013-11-15 NOTE — Telephone Encounter (Signed)
Pt is coming for labs at 215pm today. Please advise what orders need to be placed. Thanks!

## 2013-11-15 NOTE — Telephone Encounter (Signed)
i ordered

## 2013-11-16 LAB — TESTOSTERONE: TESTOSTERONE: 341.63 ng/dL (ref 300.00–890.00)

## 2013-12-10 ENCOUNTER — Encounter: Payer: Self-pay | Admitting: Endocrinology

## 2013-12-10 ENCOUNTER — Ambulatory Visit (INDEPENDENT_AMBULATORY_CARE_PROVIDER_SITE_OTHER): Payer: BC Managed Care – PPO | Admitting: Family Medicine

## 2013-12-10 ENCOUNTER — Ambulatory Visit (HOSPITAL_COMMUNITY)
Admission: RE | Admit: 2013-12-10 | Discharge: 2013-12-10 | Disposition: A | Discharge status: Home / Self Care | Payer: BC Managed Care – PPO | Source: Ambulatory Visit | Attending: Physician Assistant | Admitting: Physician Assistant

## 2013-12-10 VITALS — BP 138/78 | HR 91 | Temp 97.9°F | Resp 18 | Ht 70.0 in | Wt 225.2 lb

## 2013-12-10 DIAGNOSIS — M79605 Pain in left leg: Secondary | ICD-10-CM

## 2013-12-10 DIAGNOSIS — I824Z2 Acute embolism and thrombosis of unspecified deep veins of left distal lower extremity: Secondary | ICD-10-CM | POA: Diagnosis not present

## 2013-12-10 DIAGNOSIS — R609 Edema, unspecified: Secondary | ICD-10-CM

## 2013-12-10 DIAGNOSIS — I82412 Acute embolism and thrombosis of left femoral vein: Secondary | ICD-10-CM | POA: Diagnosis not present

## 2013-12-10 DIAGNOSIS — I82432 Acute embolism and thrombosis of left popliteal vein: Secondary | ICD-10-CM | POA: Insufficient documentation

## 2013-12-10 DIAGNOSIS — M79609 Pain in unspecified limb: Secondary | ICD-10-CM

## 2013-12-10 DIAGNOSIS — I82442 Acute embolism and thrombosis of left tibial vein: Secondary | ICD-10-CM | POA: Diagnosis not present

## 2013-12-10 DIAGNOSIS — I82402 Acute embolism and thrombosis of unspecified deep veins of left lower extremity: Secondary | ICD-10-CM

## 2013-12-10 DIAGNOSIS — Z86718 Personal history of other venous thrombosis and embolism: Secondary | ICD-10-CM | POA: Insufficient documentation

## 2013-12-10 MED ORDER — ENOXAPARIN SODIUM 120 MG/0.8ML ~~LOC~~ SOLN: 120.0000 mg | SUBCUTANEOUS | Status: DC

## 2013-12-10 MED ORDER — WARFARIN SODIUM 5 MG PO TABS: 5.0000 mg | ORAL_TABLET | Freq: Every day | ORAL | Status: DC

## 2013-12-10 NOTE — Progress Notes (Signed)
Subjective:    Patient ID: Roy Yang, male    DOB: 05/29/1955, 58 y.o.   MRN: [id number]  HPI Patient with PMH DVT (x2), PE, CAD, HTN, and dyslipidemia presents for left leg pain that started 4 days ago. In the middle of the night, patient woke up with intense pain that is described as deep and constant. Pain is 5-9/10 and starts in the medial left thigh and radiates down into calf. Feels similar to past DVTs, feels hot, seems swollen. Still able to walk, but is very painful. Denies trauma, but has been doing more leg exercises over past week. Denies fever or diaphoresis. Denies long flights/drives or recent surgeries. Has been taking Clomid for past 2 months. Is not on any medication currently for past DVT or PE. Increased aspirin intake this morning, but has not taken any other meds. Has never smoked.    Review of Systems  Constitutional: Positive for activity change (increased leg exercising). Negative for fever, chills, diaphoresis and fatigue.  Respiratory: Negative for chest tightness and shortness of breath.   Cardiovascular: Positive for leg swelling. Negative for chest pain and palpitations.  Musculoskeletal: Positive for myalgias (left leg). Negative for back pain and joint swelling.  Neurological: Negative for dizziness, light-headedness and headaches.  Hematological: Negative for adenopathy.       Objective:   Physical Exam  Constitutional: He is oriented to person, place, and time. He appears well-developed and well-nourished. No distress.  Blood pressure 138/78, pulse 91, temperature 97.9 F (36.6 C), temperature source Oral, resp. rate 18, height 5\' 10"  (1.778 m), weight 225 lb 3.2 oz (102.15 kg), SpO2 98 %.   HENT:  Head: Normocephalic and atraumatic.  Right Ear: External ear normal.  Left Ear: External ear normal.  Eyes: Right eye exhibits no discharge. Left eye exhibits no discharge. No scleral icterus.  Cardiovascular: Normal rate and regular rhythm.  Exam  reveals no gallop and no friction rub.   No murmur heard. Pulses:      Radial pulses are 2+ on the right side, and 2+ on the left side.       Femoral pulses are 2+ on the right side, and 2+ on the left side.      Popliteal pulses are 2+ on the right side, and 2+ on the left side.       Dorsalis pedis pulses are 2+ on the right side, and 1+ on the left side.       Posterior tibial pulses are 2+ on the right side, and 1+ on the left side.  Pulmonary/Chest: Effort normal and breath sounds normal. No respiratory distress. He has no wheezes. He has no rales.  Abdominal: Soft. Bowel sounds are normal. There is no tenderness.  Musculoskeletal: Normal range of motion. He exhibits tenderness. He exhibits no edema.       Right knee: Normal.       Left knee: Normal.       Right ankle: Normal.       Left ankle: He exhibits abnormal pulse. He exhibits normal range of motion, no swelling, no ecchymosis, no deformity and no laceration. No tenderness. Achilles tendon normal.       Right upper leg: Normal.       Left upper leg: He exhibits tenderness. He exhibits no bony tenderness, no swelling, no edema and no deformity (No cord-like abnormality felt at this time).       Right lower leg: Normal.  Left lower leg: He exhibits tenderness. He exhibits no swelling, no edema and no deformity.       Left foot: Normal.  Neurological: He is alert and oriented to person, place, and time. He has normal reflexes. He displays no atrophy and no tremor. No cranial nerve deficit or sensory deficit. He exhibits normal muscle tone. Gait (limping) abnormal.  Skin: Skin is warm, dry and intact. No rash noted. He is not diaphoretic. No erythema. No pallor.  Extremity not hot to touch   Wells Score: 2 moderate probability  US: Call report conveyed DVTs present in femoral and popliteal veins. Clot forming in calf.      Assessment & Plan:  1. Left leg pain 2. History of DVT (deep vein thrombosis) - Lower Extremity  Venous Duplex Left; Future   3. DVT (deep venous thrombosis), left - enoxaparin (LOVENOX) 120 MG/0.8ML injection; Inject 0.8 mLs (120 mg total) into the skin daily.  Dispense: 10 Syringe; Refill: 0 - warfarin (COUMADIN) 5 MG tablet; Take 1 tablet (5 mg total) by mouth daily.  Dispense: 30 tablet; Refill: 3 - Discontinue daily aspirin. - RTC in 5 days to check PT/INR.  Janan Ridgeishira Tanda Morrissey PA-C  [facility] 12/10/2013 11:25 AM

## 2013-12-10 NOTE — Progress Notes (Signed)
Bilateral lower extremity venous Dopplers completed.  There is acute DVT noted in the left femoral, popliteal, and soleal veins.  The posterior tibial and peroneal veins compress, but color fill is not adequate, cannot rule out non occlusive DVT.

## 2013-12-11 ENCOUNTER — Other Ambulatory Visit: Payer: Self-pay | Admitting: Endocrinology

## 2013-12-11 ENCOUNTER — Other Ambulatory Visit: Payer: Self-pay | Admitting: Emergency Medicine

## 2013-12-11 MED ORDER — HYDROCODONE-ACETAMINOPHEN 5-325 MG PO TABS: 1.0000 | ORAL_TABLET | Freq: Four times a day (QID) | ORAL | Status: DC | PRN

## 2013-12-11 NOTE — Telephone Encounter (Signed)
Patient called to ask if we could run his prescription for hydrocodone out to his car b/c he is in a lot of pain.  Told patient he had to come inside and sign for the prescription.

## 2013-12-12 ENCOUNTER — Encounter: Payer: Self-pay | Admitting: Physician Assistant

## 2013-12-15 ENCOUNTER — Ambulatory Visit (INDEPENDENT_AMBULATORY_CARE_PROVIDER_SITE_OTHER): Payer: BC Managed Care – PPO | Admitting: Physician Assistant

## 2013-12-15 VITALS — BP 130/76 | HR 87 | Temp 97.7°F | Resp 18 | Ht 70.0 in

## 2013-12-15 DIAGNOSIS — I82402 Acute embolism and thrombosis of unspecified deep veins of left lower extremity: Secondary | ICD-10-CM

## 2013-12-15 DIAGNOSIS — I82409 Acute embolism and thrombosis of unspecified deep veins of unspecified lower extremity: Secondary | ICD-10-CM | POA: Insufficient documentation

## 2013-12-15 LAB — PROTIME-INR
INR: 2.46 — ABNORMAL HIGH (ref <1.50)
PROTHROMBIN TIME: 26.7 s — AB (ref 11.6–15.2)

## 2013-12-15 MED ORDER — ENOXAPARIN SODIUM 120 MG/0.8ML ~~LOC~~ SOLN: 120.0000 mg | SUBCUTANEOUS | Status: DC

## 2013-12-15 NOTE — Progress Notes (Signed)
   Subjective:    Patient ID: Roy Yang, male    DOB: 10/19/1955, 58 y.o.   MRN: [id number]  HPI Patient presents for PT/INR check following DVT dx on 12/10/13. Pain in thigh improved, but pain in calf is worse. With hydrocodone pain tolerable. Elevating leg makes pain better. Walking/standing makes worse. No new warmth or pain. Taking warfarin and Lovenox without side effects and no missed doses. Denies SOB/CP, HA/dizziness, or bleeding/bruising.    Review of Systems  Constitutional: Negative for fever and fatigue.  Respiratory: Negative for cough, chest tightness and shortness of breath.   Cardiovascular: Negative for chest pain, palpitations and leg swelling.  Neurological: Negative for dizziness, light-headedness and headaches.  Hematological: Does not bruise/bleed easily.       Objective:   Physical Exam  Constitutional: He is oriented to person, place, and time. He appears well-developed and well-nourished. No distress.  Blood pressure 130/76, pulse 87, temperature 97.7 F (36.5 C), temperature source Oral, resp. rate 18, height 5\' 10"  (1.778 m), weight 0 lb (0 kg), SpO2 97 %.   HENT:  Head: Normocephalic and atraumatic.  Right Ear: External ear normal.  Left Ear: External ear normal.  Eyes: Right eye exhibits no discharge. Left eye exhibits no discharge. No scleral icterus.  Cardiovascular: Normal rate, regular rhythm, normal heart sounds and intact distal pulses.  Exam reveals no gallop and no friction rub.   No murmur heard. Pulmonary/Chest: Effort normal and breath sounds normal. No respiratory distress. He has no decreased breath sounds. He has no wheezes. He has no rhonchi. He has no rales.  Neurological: He is alert and oriented to person, place, and time.  Skin: Skin is warm and dry. No rash noted. He is not diaphoretic. No erythema. No pallor.        Assessment & Plan:  1. DVT (deep venous thrombosis), left - Protime-INR - Ambulatory referral to  Anticoagulation Monitoring - enoxaparin (LOVENOX) 120 MG/0.8ML injection; Inject 0.8 mLs (120 mg total) into the skin daily.  Dispense: 3 Syringe; Refill: 0 - Continue Warfarin. - If appt not with Coumadin clinic not made by Monday RTC for repeat PT/INR.  Roy Ridgeishira Albana Saperstein PA-C  [facility] 12/15/2013 1:01 PM

## 2013-12-15 NOTE — Progress Notes (Signed)
I was directly involved with the patient's care and agree with the diagnosis and treatment plan.  

## 2013-12-15 NOTE — Patient Instructions (Signed)

## 2013-12-18 ENCOUNTER — Telehealth: Payer: Self-pay

## 2013-12-18 ENCOUNTER — Telehealth: Payer: Self-pay | Admitting: Physician Assistant

## 2013-12-18 NOTE — Telephone Encounter (Signed)
Pt requesting letter to present to airline since he has a blood clot and should not fly,   Best phone for pt is [phone]

## 2013-12-18 NOTE — Telephone Encounter (Signed)
Letter printed. Pt requested it faxed to [phone]

## 2013-12-18 NOTE — Addendum Note (Signed)
Addended by: Hinton RaoBREWINGTON, Syris Brookens R on: 12/18/2013 05:55 PM   Modules accepted: Orders

## 2013-12-18 NOTE — Telephone Encounter (Signed)
Spoke with patient. INR within therapeutic range. Since unable to schedule with warfarin clinic yet should RTC for recheck tomorrow 12/19/13. Patient agrees.

## 2013-12-19 NOTE — Telephone Encounter (Signed)
Pt returned call, please call back  At [phone]

## 2013-12-20 ENCOUNTER — Telehealth: Payer: Self-pay

## 2013-12-20 DIAGNOSIS — I82409 Acute embolism and thrombosis of unspecified deep veins of unspecified lower extremity: Secondary | ICD-10-CM

## 2013-12-20 NOTE — Telephone Encounter (Signed)
Patient stated Carita Pianashira Brewington PA requested he come in for a clotting panel (blood work). Patient stated he came to the walk in clinic yesterday and was told he had to have an office visit. Patient stated the wait time was about 2 hours and he couldn't wait that long. Patient doesn't understand why he needs an office visit if he was told by the PA it would be a quick appointment since he only needed blood work. Patients call back number is [phone]

## 2013-12-20 NOTE — Telephone Encounter (Signed)
Please advise if lab orders are to be in the system for this pt to rtn to have labs only done or if needs to come in for a recheck. Pt came in yesterday and last telephone message states pt to rtn for a recheck.

## 2013-12-22 ENCOUNTER — Other Ambulatory Visit (INDEPENDENT_AMBULATORY_CARE_PROVIDER_SITE_OTHER): Payer: BC Managed Care – PPO | Admitting: *Deleted

## 2013-12-22 DIAGNOSIS — I82409 Acute embolism and thrombosis of unspecified deep veins of unspecified lower extremity: Secondary | ICD-10-CM

## 2013-12-22 NOTE — Progress Notes (Signed)
Pt here for lab draw only  

## 2013-12-22 NOTE — Telephone Encounter (Signed)
Left voicemail for pt to RTC for recheck of PT/INR. Lab order is in. Does not have to wait.

## 2013-12-23 LAB — PROTIME-INR
INR: 3.28 — ABNORMAL HIGH (ref <1.50)
Prothrombin Time: 33.4 seconds — ABNORMAL HIGH (ref 11.6–15.2)

## 2013-12-26 ENCOUNTER — Telehealth: Payer: Self-pay | Admitting: Physician Assistant

## 2013-12-26 ENCOUNTER — Ambulatory Visit (INDEPENDENT_AMBULATORY_CARE_PROVIDER_SITE_OTHER): Payer: BC Managed Care – PPO

## 2013-12-26 DIAGNOSIS — Z0000 Encounter for general adult medical examination without abnormal findings: Secondary | ICD-10-CM | POA: Insufficient documentation

## 2013-12-26 DIAGNOSIS — I82402 Acute embolism and thrombosis of unspecified deep veins of left lower extremity: Secondary | ICD-10-CM

## 2013-12-26 DIAGNOSIS — Z5181 Encounter for therapeutic drug level monitoring: Secondary | ICD-10-CM

## 2013-12-26 LAB — POCT INR: INR: 4.3

## 2013-12-26 NOTE — Patient Instructions (Signed)

## 2013-12-26 NOTE — Telephone Encounter (Signed)
Opened in error

## 2013-12-27 NOTE — Telephone Encounter (Signed)
Patient currently followed at Coumadin clinic. Encounter opened in error.

## 2014-01-03 ENCOUNTER — Ambulatory Visit (INDEPENDENT_AMBULATORY_CARE_PROVIDER_SITE_OTHER): Payer: BC Managed Care – PPO | Admitting: *Deleted

## 2014-01-03 DIAGNOSIS — Z5181 Encounter for therapeutic drug level monitoring: Secondary | ICD-10-CM

## 2014-01-03 DIAGNOSIS — I82402 Acute embolism and thrombosis of unspecified deep veins of left lower extremity: Secondary | ICD-10-CM

## 2014-01-03 DIAGNOSIS — I82409 Acute embolism and thrombosis of unspecified deep veins of unspecified lower extremity: Secondary | ICD-10-CM

## 2014-01-03 LAB — POCT INR: INR: 3.5

## 2014-01-11 ENCOUNTER — Ambulatory Visit (INDEPENDENT_AMBULATORY_CARE_PROVIDER_SITE_OTHER): Payer: BC Managed Care – PPO | Admitting: *Deleted

## 2014-01-11 DIAGNOSIS — I82402 Acute embolism and thrombosis of unspecified deep veins of left lower extremity: Secondary | ICD-10-CM

## 2014-01-11 DIAGNOSIS — Z5181 Encounter for therapeutic drug level monitoring: Secondary | ICD-10-CM

## 2014-01-11 LAB — POCT INR: INR: 2.7

## 2014-01-17 ENCOUNTER — Ambulatory Visit (INDEPENDENT_AMBULATORY_CARE_PROVIDER_SITE_OTHER): Payer: BC Managed Care – PPO | Admitting: *Deleted

## 2014-01-17 DIAGNOSIS — I82402 Acute embolism and thrombosis of unspecified deep veins of left lower extremity: Secondary | ICD-10-CM

## 2014-01-17 DIAGNOSIS — Z5181 Encounter for therapeutic drug level monitoring: Secondary | ICD-10-CM

## 2014-01-17 LAB — POCT INR: INR: 3.1

## 2014-02-02 ENCOUNTER — Ambulatory Visit (INDEPENDENT_AMBULATORY_CARE_PROVIDER_SITE_OTHER): Payer: BLUE CROSS/BLUE SHIELD

## 2014-02-02 DIAGNOSIS — I82402 Acute embolism and thrombosis of unspecified deep veins of left lower extremity: Secondary | ICD-10-CM

## 2014-02-02 DIAGNOSIS — Z5181 Encounter for therapeutic drug level monitoring: Secondary | ICD-10-CM

## 2014-02-02 LAB — POCT INR: INR: 2.2

## 2014-02-23 ENCOUNTER — Ambulatory Visit: Payer: BC Managed Care – PPO | Admitting: Interventional Cardiology

## 2014-02-23 ENCOUNTER — Ambulatory Visit (INDEPENDENT_AMBULATORY_CARE_PROVIDER_SITE_OTHER): Payer: BLUE CROSS/BLUE SHIELD | Admitting: Pharmacist

## 2014-02-23 DIAGNOSIS — I82402 Acute embolism and thrombosis of unspecified deep veins of left lower extremity: Secondary | ICD-10-CM

## 2014-02-23 DIAGNOSIS — Z5181 Encounter for therapeutic drug level monitoring: Secondary | ICD-10-CM

## 2014-02-23 LAB — POCT INR: INR: 2.3

## 2014-03-01 DIAGNOSIS — I2699 Other pulmonary embolism without acute cor pulmonale: Secondary | ICD-10-CM | POA: Insufficient documentation

## 2014-03-02 ENCOUNTER — Ambulatory Visit (INDEPENDENT_AMBULATORY_CARE_PROVIDER_SITE_OTHER): Payer: BLUE CROSS/BLUE SHIELD | Admitting: Interventional Cardiology

## 2014-03-02 ENCOUNTER — Encounter: Payer: Self-pay | Admitting: Interventional Cardiology

## 2014-03-02 VITALS — BP 128/84 | HR 61 | Ht 70.0 in | Wt 230.8 lb

## 2014-03-02 DIAGNOSIS — E785 Hyperlipidemia, unspecified: Secondary | ICD-10-CM

## 2014-03-02 DIAGNOSIS — I2510 Atherosclerotic heart disease of native coronary artery without angina pectoris: Secondary | ICD-10-CM

## 2014-03-02 DIAGNOSIS — I10 Essential (primary) hypertension: Secondary | ICD-10-CM

## 2014-03-02 DIAGNOSIS — Z86718 Personal history of other venous thrombosis and embolism: Secondary | ICD-10-CM

## 2014-03-02 MED ORDER — METOPROLOL SUCCINATE ER 25 MG PO TB24: 25.0000 mg | ORAL_TABLET | ORAL | Status: DC

## 2014-03-02 NOTE — Progress Notes (Signed)
Patient ID: Roy SarinJeffrey A Weisberg, male   DOB: 12/22/1955, 59 y.o.   MRN: [id number]    Cardiology Office Note   Date:  03/02/2014   ID:  Roy SarinJeffrey A Hobart, DOB 05/27/1955, MRN [id number]  PCP:  Lucilla EdinAUB, STEVE A, MD  Cardiologist:   Lesleigh NoeSMITH III,HENRY W, MD   No chief complaint on file.     History of Present Illness: Roy Yang is a 59 y.o. male who presents for had DVT. He denies angina. He has not had syncope. He continues to have intermittent left lower extremity swelling on a chronic basis since the recent DVT. He denies orthopnea. He has not needed nitroglycerin. No bleeding on Coumadin. Workup for clotting has been negative.    Past Medical History  Diagnosis Date  . CAD (coronary artery disease)   . Gout   . HSV-2 (herpes simplex virus 2) infection   . Hyperlipidemia   . Thyroid disease   . Clotting disorder   . DVT, lower extremity, recurrent   . PE (pulmonary embolism) 1998    Past Surgical History  Procedure Laterality Date  . Vasectomy       Current Outpatient Prescriptions  Medication Sig Dispense Refill  . allopurinol (ZYLOPRIM) 300 MG tablet Take 300 mg by mouth daily.    . metoprolol succinate (TOPROL-XL) 25 MG 24 hr tablet Take 1 tablet (25 mg total) by mouth every other day. 90 tablet 3  . valACYclovir (VALTREX) 1000 MG tablet Take 1 tablet (1,000 mg total) by mouth 2 (two) times daily. (Patient taking differently: Take 1,000 mg by mouth as needed (cold sore). ) 30 tablet 11  . warfarin (COUMADIN) 5 MG tablet Take 1 tablet (5 mg total) by mouth daily. 30 tablet 3   No current facility-administered medications for this visit.    Allergies:   Review of patient's allergies indicates no known allergies.    Social History:  The patient  reports that he has never smoked. He does not have any smokeless tobacco history on file. He reports that he drinks about 2.0 - 2.5 oz of alcohol per week. He reports that he does not use illicit drugs.   Family History:  The  patient's family history includes Atrial fibrillation in his father and mother; Peripheral vascular disease in his father.    ROS:  Please see the history of present illness.   Otherwise, review of systems are positive for none.   All other systems are reviewed and negative.    PHYSICAL EXAM: VS:  BP 128/84 mmHg  Pulse 61  Ht 5\' 10"  (1.778 m)  Wt 230 lb 12.8 oz (104.69 kg)  BMI 33.12 kg/m2 , BMI Body mass index is 33.12 kg/(m^2). GEN: Well nourished, well developed, in no acute distress HEENT: normal Neck: no JVD, carotid bruits, or masses Cardiac: RRR; no murmurs, rubs, or gallops,no edema  Respiratory:  clear to auscultation bilaterally, normal work of breathing GI: soft, nontender, nondistended, + BS MS: no deformity or atrophy Skin: warm and dry, no rash Neuro:  Strength and sensation are intact Psych: euthymic mood, full affect   EKG:  EKG is ordered today. The ekg ordered today demonstrates NSR with LAHB   Recent Labs: 08/14/2013: ALT 29; BUN 21; Creatinine 1.33; Potassium 4.6; Sodium 138; TSH 2.854    Lipid Panel    Component Value Date/Time   CHOL 164 08/14/2013 0843   TRIG 57 08/14/2013 0843   HDL 51 08/14/2013 0843   CHOLHDL 3.2 08/14/2013 0843  VLDL 11 08/14/2013 0843   LDLCALC 102* 08/14/2013 0843      Wt Readings from Last 3 Encounters:  03/02/14 230 lb 12.8 oz (104.69 kg)  12/10/13 225 lb 3.2 oz (102.15 kg)  09/12/13 227 lb (102.967 kg)      Other studies Reviewed: Additional studies/ records that were reviewed today include: . Review of the above records demonstrates:    ASSESSMENT AND PLAN:  1.  Coronary artery disease is asymptomatic. 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 2. Hiistory of recurrent DVT 3. Essential hypertension, controlled 4. Hyperlipidemia followed by primary care, Dr. Cleta Alberts 5. Mild chronic left lower extremity swelling after most recent episode of DVT. 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  The patient does not have concerns regarding medicines.  The following changes have been made:  no change  Labs/ tests ordered today include: None   Orders Placed This Encounter  Procedures  . EKG 12-Lead     Disposition:   FU with Mendel Ryder in 1 Year   Signed, Lesleigh Noe, MD  03/02/2014 12:43 PM    [facility] [address] [place], [place], [place]  [address] Phone: [phone]; Fax: [phone]

## 2014-03-02 NOTE — Patient Instructions (Signed)
Your physician recommends that you continue on your current medications as directed. Please refer to the Current Medication list given to you today.  Your physician wants you to follow-up in: 1 year with Dr.Smith You will receive a reminder letter in the mail two months in advance. If you don't receive a letter, please call our office to schedule the follow-up appointment.  

## 2014-03-23 ENCOUNTER — Ambulatory Visit (INDEPENDENT_AMBULATORY_CARE_PROVIDER_SITE_OTHER): Payer: BLUE CROSS/BLUE SHIELD

## 2014-03-23 DIAGNOSIS — I82402 Acute embolism and thrombosis of unspecified deep veins of left lower extremity: Secondary | ICD-10-CM

## 2014-03-23 DIAGNOSIS — Z5181 Encounter for therapeutic drug level monitoring: Secondary | ICD-10-CM

## 2014-03-23 LAB — POCT INR: INR: 1.9

## 2014-04-03 ENCOUNTER — Encounter: Payer: Self-pay | Admitting: Emergency Medicine

## 2014-04-03 ENCOUNTER — Ambulatory Visit (INDEPENDENT_AMBULATORY_CARE_PROVIDER_SITE_OTHER): Payer: BLUE CROSS/BLUE SHIELD | Admitting: Emergency Medicine

## 2014-04-03 VITALS — BP 132/79 | HR 70 | Temp 97.9°F | Resp 16 | Ht 69.25 in | Wt 231.2 lb

## 2014-04-03 DIAGNOSIS — E291 Testicular hypofunction: Secondary | ICD-10-CM

## 2014-04-03 DIAGNOSIS — E785 Hyperlipidemia, unspecified: Secondary | ICD-10-CM | POA: Diagnosis not present

## 2014-04-03 DIAGNOSIS — Z0000 Encounter for general adult medical examination without abnormal findings: Secondary | ICD-10-CM | POA: Diagnosis not present

## 2014-04-03 DIAGNOSIS — Z8639 Personal history of other endocrine, nutritional and metabolic disease: Secondary | ICD-10-CM

## 2014-04-03 DIAGNOSIS — I10 Essential (primary) hypertension: Secondary | ICD-10-CM

## 2014-04-03 DIAGNOSIS — R7989 Other specified abnormal findings of blood chemistry: Secondary | ICD-10-CM

## 2014-04-03 DIAGNOSIS — I82402 Acute embolism and thrombosis of unspecified deep veins of left lower extremity: Secondary | ICD-10-CM | POA: Diagnosis not present

## 2014-04-03 DIAGNOSIS — Z8739 Personal history of other diseases of the musculoskeletal system and connective tissue: Secondary | ICD-10-CM

## 2014-04-03 LAB — TSH: TSH: 3.953 u[IU]/mL (ref 0.350–4.500)

## 2014-04-03 LAB — LIPID PANEL
CHOL/HDL RATIO: 4.2 ratio
Cholesterol: 193 mg/dL (ref 0–200)
HDL: 46 mg/dL (ref >=40)
LDL Cholesterol: 125 mg/dL — ABNORMAL HIGH (ref 0–99)
Triglycerides: 109 mg/dL (ref <150)
VLDL: 22 mg/dL (ref 0–40)

## 2014-04-03 LAB — CBC WITH DIFFERENTIAL/PLATELET
BASOS ABS: 0.1 10*3/uL (ref 0.0–0.1)
BASOS PCT: 1 % (ref 0–1)
EOS ABS: 0.3 10*3/uL (ref 0.0–0.7)
Eosinophils Relative: 5 % (ref 0–5)
HCT: 47.2 % (ref 39.0–52.0)
Hemoglobin: 16.1 g/dL (ref 13.0–17.0)
Lymphocytes Relative: 45 % (ref 12–46)
Lymphs Abs: 2.6 10*3/uL (ref 0.7–4.0)
MCH: 33.1 pg (ref 26.0–34.0)
MCHC: 34.1 g/dL (ref 30.0–36.0)
MCV: 97.1 fL (ref 78.0–100.0)
MONO ABS: 0.6 10*3/uL (ref 0.1–1.0)
MPV: 9.9 fL (ref 8.6–12.4)
Monocytes Relative: 11 % (ref 3–12)
Neutro Abs: 2.2 10*3/uL (ref 1.7–7.7)
Neutrophils Relative %: 38 % — ABNORMAL LOW (ref 43–77)
Platelets: 159 10*3/uL (ref 150–400)
RBC: 4.86 MIL/uL (ref 4.22–5.81)
RDW: 14.8 % (ref 11.5–15.5)
WBC: 5.8 10*3/uL (ref 4.0–10.5)

## 2014-04-03 LAB — COMPLETE METABOLIC PANEL WITH GFR
ALK PHOS: 50 U/L (ref 39–117)
ALT: 14 U/L (ref 0–53)
AST: 15 U/L (ref 0–37)
Albumin: 3.8 g/dL (ref 3.5–5.2)
BUN: 26 mg/dL — ABNORMAL HIGH (ref 6–23)
CHLORIDE: 106 meq/L (ref 96–112)
CO2: 24 meq/L (ref 19–32)
CREATININE: 1.17 mg/dL (ref 0.50–1.35)
Calcium: 8.8 mg/dL (ref 8.4–10.5)
GFR, EST AFRICAN AMERICAN: 78 mL/min
GFR, EST NON AFRICAN AMERICAN: 67 mL/min
Glucose, Bld: 97 mg/dL (ref 70–99)
POTASSIUM: 4.5 meq/L (ref 3.5–5.3)
Sodium: 140 mEq/L (ref 135–145)
Total Bilirubin: 0.5 mg/dL (ref 0.2–1.2)
Total Protein: 6.3 g/dL (ref 6.0–8.3)

## 2014-04-03 LAB — POCT URINALYSIS DIPSTICK
BILIRUBIN UA: NEGATIVE
Glucose, UA: NEGATIVE
Ketones, UA: NEGATIVE
LEUKOCYTES UA: NEGATIVE
Nitrite, UA: NEGATIVE
Protein, UA: NEGATIVE
RBC UA: NEGATIVE
Spec Grav, UA: 1.02
Urobilinogen, UA: 0.2
pH, UA: 5

## 2014-04-03 LAB — TESTOSTERONE, FREE, TOTAL, SHBG
SEX HORMONE BINDING: 30 nmol/L (ref 22–77)
TESTOSTERONE FREE: 93.4 pg/mL (ref 47.0–244.0)
TESTOSTERONE-% FREE: 2.2 % (ref 1.6–2.9)
Testosterone: 431 ng/dL (ref 300–890)

## 2014-04-03 LAB — URIC ACID: URIC ACID, SERUM: 5.2 mg/dL (ref 4.0–7.8)

## 2014-04-03 MED ORDER — METOPROLOL SUCCINATE ER 25 MG PO TB24: 25.0000 mg | ORAL_TABLET | ORAL | Status: DC

## 2014-04-03 MED ORDER — CLOMIPHENE CITRATE 50 MG PO TABS: ORAL_TABLET | ORAL | Status: DC

## 2014-04-03 MED ORDER — WARFARIN SODIUM 5 MG PO TABS: 5.0000 mg | ORAL_TABLET | Freq: Every day | ORAL | Status: DC

## 2014-04-03 MED ORDER — TADALAFIL 20 MG PO TABS: 10.0000 mg | ORAL_TABLET | ORAL | Status: DC | PRN

## 2014-04-03 MED ORDER — VALACYCLOVIR HCL 1 G PO TABS: 1000.0000 mg | ORAL_TABLET | Freq: Two times a day (BID) | ORAL | Status: DC

## 2014-04-03 NOTE — Progress Notes (Deleted)
   Subjective:    Patient ID: Epimenio SarinJeffrey A Parmelee, male    DOB: 08/10/1955, 59 y.o.   MRN: [id number]  HPI    Review of Systems  Constitutional: Negative.   HENT: Negative.   Eyes: Negative.   Respiratory: Negative.   Cardiovascular: Negative.   Gastrointestinal: Negative.   Endocrine: Negative.   Genitourinary: Negative.   Musculoskeletal: Negative.   Skin: Negative.   Allergic/Immunologic: Negative.   Neurological: Negative.   Hematological: Negative.   Psychiatric/Behavioral: Negative.        Objective:   Physical Exam        Assessment & Plan:

## 2014-04-03 NOTE — Progress Notes (Addendum)
Subjective:  This chart was scribed for Nena Jordan, MD by Dellis Filbert, ED Scribe at [facility].The patient was seen in exam room 22 and the patient's care was started at 1:52 PM.   Patient ID: Roy Yang, male    DOB: 09-Aug-1955, 59 y.o.   MRN: [id number] Chief Complaint  Patient presents with  . Annual Exam   HPI HPI Comments: Roy Yang is a 59 y.o. male with a history of coronary artery disease, hypertension, hyperlipidemia, DVT, and gout who presents to [facility] for an annual physical exam. Overall pt has been well. He is seen by his cardiologist once year. UTD on his colonoscopy, last done on 03/17/2013. He visits a coumadin clinic once a month. He is exercising 2-3 times a week. Pt drinks 2-3 glasses of wine a night. Following his DVT, he is concerned about pitting edema currently present in both legs. He wears sports stockings when traveling long distances. He has not seen an eye doctor.   Patient Active Problem List   Diagnosis Date Noted  . Acute pulmonary embolism 03/01/2014  . Encounter for therapeutic drug monitoring 12/26/2013  . History of DVT (deep vein thrombosis) 12/10/2013  . Hypogonadism male 09/12/2013  . CAD (coronary artery disease) 05/26/2011  . Gout 05/26/2011  . Hypertension 05/26/2011  . Hyperlipidemia 05/26/2011  . 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 CAD (coronary artery disease)   . Gout   . HSV-2 (herpes simplex virus 2) infection   . Hyperlipidemia   . Thyroid disease   . Clotting disorder   . DVT, lower extremity, recurrent   . PE (pulmonary embolism) 1998  . Depression   . Myocardial infarction    Past Surgical History  Procedure Laterality Date  . Vasectomy    . Cosmetic surgery    . Thyroid surgery     No Known Allergies Prior to Admission medications   Medication Sig Start Date End Date Taking? Authorizing Provider    allopurinol (ZYLOPRIM) 300 MG tablet Take 300 mg by mouth daily.   Yes Historical Provider, MD  clomiPHENE (CLOMID) 50 MG tablet Take by mouth daily. 1/2 tab daily   Yes Historical Provider, MD  metoprolol succinate (TOPROL-XL) 25 MG 24 hr tablet Take 1 tablet (25 mg total) by mouth every other day. 03/02/14  Yes Belva Crome, MD  valACYclovir (VALTREX) 1000 MG tablet Take 1 tablet (1,000 mg total) by mouth 2 (two) times daily. Patient taking differently: Take 1,000 mg by mouth as needed (cold sore).  08/14/13  Yes Darlyne Russian, MD  warfarin (COUMADIN) 5 MG tablet Take 1 tablet (5 mg total) by mouth daily. Patient taking differently: Take 5 mg by mouth daily. 1/2 tab m,w,f 1 tab tues, thurs, sat, sund 12/10/13  Yes Tishira R Brewington, PA-C   History   Social History  . Marital Status: Single    Spouse Name: N/A  . Number of Children: N/A  . Years of Education: N/A   Occupational History  . Not on file.   Social History Main Topics  . Smoking status: Never Smoker   . Smokeless tobacco: Not on file  . Alcohol Use: 2.0 - 2.5 oz/week    4-5 Standard drinks or equivalent per week  . Drug Use: No  . Sexual Activity: Yes    Birth Control/ Protection: Abstinence   Other Topics Concern  .  Not on file   Social History Narrative   Review of Systems  Cardiovascular: Positive for leg swelling.  13 point ROS reviewed in patient survey, negative other than listed above or in reviewed nursing note.     Objective:  BP 132/79 mmHg  Pulse 70  Temp(Src) 97.9 F (36.6 C) (Oral)  Resp 16  Ht 5' 9.25" (1.759 m)  Wt 231 lb 3.2 oz (104.872 kg)  BMI 33.89 kg/m2  SpO2 96% Physical Exam  Nursing note and vitals reviewed. CONSTITUTIONAL: Well developed/well nourished HEAD: Normocephalic/atraumatic EYES: EOMI/PERRL ENMT: Mucous membranes moist NECK: supple no meningeal signs SPINE/BACK:entire spine nontender CV: S1/S2 noted, no murmurs/rubs/gallops noted LUNGS: Lungs are clear to  auscultation bilaterally, no apparent distress ABDOMEN: soft, nontender, no rebound or guarding, bowel sounds noted throughout abdomen GU:no cva tenderness NEURO: Pt is awake/alert/appropriate, moves all extremitiesx4.  No facial droop.   EXTREMITIES: pulses normal/equal, full ROM. Regarding the left leg there are varicose veins, statis changes and +1 swelling. SKIN: warm, color normal PSYCH: no abnormalities of mood noted, alert and oriented to situation Results for orders placed or performed in visit on 04/03/14  CBC with Differential/Platelet  Result Value Ref Range   WBC 5.8 4.0 - 10.5 K/uL   RBC 4.86 4.22 - 5.81 MIL/uL   Hemoglobin 16.1 13.0 - 17.0 g/dL   HCT 47.2 39.0 - 52.0 %   MCV 97.1 78.0 - 100.0 fL   MCH 33.1 26.0 - 34.0 pg   MCHC 34.1 30.0 - 36.0 g/dL   RDW 14.8 11.5 - 15.5 %   Platelets 159 150 - 400 K/uL   MPV 9.9 8.6 - 12.4 fL   Neutrophils Relative % 38 (L) 43 - 77 %   Neutro Abs 2.2 1.7 - 7.7 K/uL   Lymphocytes Relative 45 12 - 46 %   Lymphs Abs 2.6 0.7 - 4.0 K/uL   Monocytes Relative 11 3 - 12 %   Monocytes Absolute 0.6 0.1 - 1.0 K/uL   Eosinophils Relative 5 0 - 5 %   Eosinophils Absolute 0.3 0.0 - 0.7 K/uL   Basophils Relative 1 0 - 1 %   Basophils Absolute 0.1 0.0 - 0.1 K/uL   Smear Review Criteria for review not met   COMPLETE METABOLIC PANEL WITH GFR  Result Value Ref Range   Sodium  135 - 145 mEq/L   Potassium  3.5 - 5.3 mEq/L   Chloride  96 - 112 mEq/L   CO2  19 - 32 mEq/L   Glucose, Bld  70 - 99 mg/dL   BUN  6 - 23 mg/dL   Creat  0.50 - 1.35 mg/dL   Total Bilirubin  0.2 - 1.2 mg/dL   Alkaline Phosphatase  39 - 117 U/L   AST  0 - 37 U/L   ALT  0 - 53 U/L   Total Protein  6.0 - 8.3 g/dL   Albumin  3.5 - 5.2 g/dL   Calcium  8.4 - 10.5 mg/dL   GFR, Est African American  mL/min   GFR, Est Non African American  mL/min  TSH  Result Value Ref Range   TSH 3.953 0.350 - 4.500 uIU/mL  Lipid panel  Result Value Ref Range   Cholesterol  0 - 200 mg/dL    Triglycerides  <150 mg/dL   HDL  >=40 mg/dL   Total CHOL/HDL Ratio  Ratio   VLDL  0 - 40 mg/dL   LDL Cholesterol  0 - 99 mg/dL  Testosterone, Free, Total, SHBG  Result Value Ref Range   Testosterone 431 300 - 890 ng/dL   Sex Hormone Binding  22 - 77 nmol/L   Testosterone, Free  47.0 - 244.0 pg/mL   Testosterone-% Free  1.6 - 2.9 %  Uric acid  Result Value Ref Range   Uric Acid, Serum  4.0 - 7.8 mg/dL  POCT urinalysis dipstick  Result Value Ref Range   Color, UA yellow    Clarity, UA clear    Glucose, UA neg    Bilirubin, UA neg    Ketones, UA neg    Spec Grav, UA 1.020    Blood, UA neg    pH, UA 5.0    Protein, UA neg    Urobilinogen, UA 0.2    Nitrite, UA neg    Leukocytes, UA Negative    Meds ordered this encounter  Medications  . clomiPHENE (CLOMID) 50 MG tablet    Sig: Take by mouth daily. 1/2 tab daily  . valACYclovir (VALTREX) 1000 MG tablet    Sig: Take 1 tablet (1,000 mg total) by mouth 2 (two) times daily.    Dispense:  30 tablet    Refill:  11  . metoprolol succinate (TOPROL-XL) 25 MG 24 hr tablet    Sig: Take 1 tablet (25 mg total) by mouth every other day.    Dispense:  90 tablet    Refill:  3  . warfarin (COUMADIN) 5 MG tablet    Sig: Take 1 tablet (5 mg total) by mouth daily. 1/2 tab m,w,f 1 tab tues, thurs, sat, sund    Dispense:  30 tablet    Refill:  3  . clomiPHENE (CLOMID) 50 MG tablet    Sig: Take one half tablet daily    Dispense:  30 tablet    Refill:  11  . tadalafil (CIALIS) 20 MG tablet    Sig: Take 0.5-1 tablets (10-20 mg total) by mouth every other day as needed for erectile dysfunction.    Dispense:  5 tablet    Refill:  11      Assessment & Plan:   Patient is doing well. I did make a referral to vein and vascular regarding the swelling he has in his leg. Overall he is doing great. Recent cardiac check was good. He is up-to-date on his colonoscopy. He is up-to-date on his other immunizations.I personally performed the services  described in this documentation, which was scribed in my presence. The recorded information has been reviewed and is accurate.

## 2014-04-11 ENCOUNTER — Other Ambulatory Visit: Payer: Self-pay

## 2014-04-11 DIAGNOSIS — I82402 Acute embolism and thrombosis of unspecified deep veins of left lower extremity: Secondary | ICD-10-CM

## 2014-04-11 DIAGNOSIS — M7989 Other specified soft tissue disorders: Secondary | ICD-10-CM

## 2014-04-19 ENCOUNTER — Encounter: Payer: Self-pay | Admitting: Surgery

## 2014-04-20 ENCOUNTER — Ambulatory Visit (INDEPENDENT_AMBULATORY_CARE_PROVIDER_SITE_OTHER): Payer: BLUE CROSS/BLUE SHIELD | Admitting: *Deleted

## 2014-04-20 DIAGNOSIS — Z5181 Encounter for therapeutic drug level monitoring: Secondary | ICD-10-CM

## 2014-04-20 DIAGNOSIS — I82402 Acute embolism and thrombosis of unspecified deep veins of left lower extremity: Secondary | ICD-10-CM | POA: Diagnosis not present

## 2014-04-20 LAB — POCT INR: INR: 2.7

## 2014-04-23 ENCOUNTER — Encounter: Payer: Self-pay | Admitting: Surgery

## 2014-04-23 ENCOUNTER — Telehealth: Payer: Self-pay

## 2014-04-23 ENCOUNTER — Ambulatory Visit (HOSPITAL_COMMUNITY)
Admission: RE | Admit: 2014-04-23 | Discharge: 2014-04-23 | Disposition: A | Discharge status: Home / Self Care | Payer: BLUE CROSS/BLUE SHIELD | Source: Ambulatory Visit | Attending: Surgery | Admitting: Surgery

## 2014-04-23 ENCOUNTER — Ambulatory Visit (INDEPENDENT_AMBULATORY_CARE_PROVIDER_SITE_OTHER): Payer: BLUE CROSS/BLUE SHIELD | Admitting: Surgery

## 2014-04-23 VITALS — BP 132/81 | HR 66 | Temp 98.2°F | Resp 16 | Ht 69.5 in | Wt 229.8 lb

## 2014-04-23 DIAGNOSIS — I82432 Acute embolism and thrombosis of left popliteal vein: Secondary | ICD-10-CM | POA: Diagnosis not present

## 2014-04-23 DIAGNOSIS — I82402 Acute embolism and thrombosis of unspecified deep veins of left lower extremity: Secondary | ICD-10-CM | POA: Diagnosis not present

## 2014-04-23 DIAGNOSIS — I82412 Acute embolism and thrombosis of left femoral vein: Secondary | ICD-10-CM | POA: Diagnosis not present

## 2014-04-23 DIAGNOSIS — M7989 Other specified soft tissue disorders: Secondary | ICD-10-CM

## 2014-04-23 NOTE — Telephone Encounter (Signed)
Crestor request - NEW   [facility] [address]   [phone]

## 2014-04-23 NOTE — Progress Notes (Signed)
Patient name: Roy Yang MRN: [id number] DOB: 12/23/1955 Sex: male   Referred by: Dr. Cleta Albertsaub  Reason for referral:  Chief Complaint  Patient presents with  . New Evaluation    referred per Dr. Cleta Albertsaub for continiued swelling LLE post DVT, diag. 11/2013    HISTORY OF PRESENT ILLNESS: This is a very pleasant 59 year old gentleman who is referred today for further evaluation of his left leg DVT.  He has had 3 DVTs.  The first was in 1999 and this was associated with a PE.  The second was in 2010, and the third 2015.  The first 2 were associated with long travel history.  He was on Coumadin for a short time.  He had a negative hypercoagulable workup and Coumadin was discontinued.  Around 2010, [facility] and it was felt that he can come off of his Coumadin with plans for Lovenox injections for prolonged travel.  He had a third DVT in November 2015 and is currently on Coumadin.  He does complain of left leg swelling.  The patient has a history of coronary artery disease and had a heart attack with stenting.  He has hypercholesterolemia but recently discontinued his statin.  His most recent LDL was 125.  He is not on an aspirin.  His blood pressure is medically managed.  He is a nonsmoker.  Past Medical History  Diagnosis Date  . CAD (coronary artery disease)   . Gout   . HSV-2 (herpes simplex virus 2) infection   . Hyperlipidemia   . Thyroid disease   . Clotting disorder   . DVT, lower extremity, recurrent   . PE (pulmonary embolism) 1998  . Depression   . Myocardial infarction     Past Surgical History  Procedure Laterality Date  . Vasectomy    . Cosmetic surgery    . Thyroid surgery      History   Social History  . Marital Status: Single    Spouse Name: N/A  . Number of Children: N/A  . Years of Education: N/A   Occupational History  . Not on file.   Social History Main Topics  . Smoking status: Never Smoker   . Smokeless tobacco: Not on file   . Alcohol Use: 2.0 - 2.5 oz/week    4-5 Standard drinks or equivalent per week  . Drug Use: No  . Sexual Activity: Yes    Birth Control/ Protection: Abstinence   Other Topics Concern  . Not on file   Social History Narrative    Family History  Problem Relation Age of Onset  . Atrial fibrillation Mother   . Cancer Mother   . Hyperlipidemia Mother   . Atrial fibrillation Father   . Peripheral vascular disease Father   . Cancer Father   . Heart disease Father   . Hyperlipidemia Father   . Hypertension Father   . Heart disease Brother     Allergies as of 04/23/2014  . (No Known Allergies)    Current Outpatient Prescriptions on File Prior to Visit  Medication Sig Dispense Refill  . allopurinol (ZYLOPRIM) 300 MG tablet Take 300 mg by mouth daily.    . clomiPHENE (CLOMID) 50 MG tablet Take by mouth daily. 1/2 tab daily    . clomiPHENE (CLOMID) 50 MG tablet Take one half tablet daily 30 tablet 11  . metoprolol succinate (TOPROL-XL) 25 MG 24 hr tablet Take 1 tablet (25 mg total) by mouth every  other day. 90 tablet 3  . tadalafil (CIALIS) 20 MG tablet Take 0.5-1 tablets (10-20 mg total) by mouth every other day as needed for erectile dysfunction. 5 tablet 11  . valACYclovir (VALTREX) 1000 MG tablet Take 1 tablet (1,000 mg total) by mouth 2 (two) times daily. 30 tablet 11  . warfarin (COUMADIN) 5 MG tablet Take 1 tablet (5 mg total) by mouth daily. 1/2 tab m,w,f 1 tab tues, thurs, sat, sund 30 tablet 3   No current facility-administered medications on file prior to visit.     REVIEW OF SYSTEMS: Cardiovascular: No chest pain, chest pressure, palpitations, orthopnea, or dyspnea on exertion. No claudication or rest pain, history of DVT.  See history of present illness Pulmonary: No productive cough, asthma or wheezing. Neurologic: No weakness, paresthesias, aphasia, or amaurosis. No dizziness. Hematologic: No bleeding problems or clotting disorders. Musculoskeletal: No joint  pain or joint swelling. Gastrointestinal: No blood in stool or hematemesis Genitourinary: No dysuria or hematuria. Psychiatric:: No history of major depression. Integumentary: No rashes or ulcers. Constitutional: No fever or chills.  PHYSICAL EXAMINATION:  Filed Vitals:   04/23/14 1318  BP: 132/81  Pulse: 66  Temp: 98.2 F (36.8 C)  TempSrc: Oral  Resp: 16  Height: 5' 9.5" (1.765 m)  Weight: 229 lb 12.8 oz (104.237 kg)   Body mass index is 33.46 kg/(m^2). General: The patient appears their stated age.   HEENT:  No gross abnormalities Pulmonary: Respirations are non-labored Abdomen: Soft and non-tender.  No pulsatile mass.  Musculoskeletal: There are no major deformities.   Neurologic: No focal weakness or paresthesias are detected, Skin: There are no ulcer or rashes noted. Psychiatric: The patient has normal affect. Cardiovascular: There is a regular rate and rhythm without significant murmur appreciated.  Palpable posterior tibial pulse bilaterally.  2+ edema, left leg.  No carotid bruits.  Diagnostic Studies: Venous ultrasound was reviewed.  He has deep vein reflux with no significant superficial vein reflux.  There is partial noncompressible thrombus within the proximal left femoral vein extending to the popliteal vein.    Assessment:  Recurrent left leg DVT Plan: I discussed with the patient that I would not stop his anticoagulation, even with a negative hypercoagulable workup, as this is his third DVT.  We discussed starting one of the newer anticoagulants to stop Coumadin.  He will look in to this.  We did discuss atherosclerotic risk factors.  Given his elevated LDL think it would be reasonable to start him back on a statin as well as place him back on a baby aspirin as I do not think this will significantly impact his bleeding risk.  With regards to his swelling, I would recommend compression stockings.  He'll follow up on an as-needed basis.     Jorge Ny, M.D. [facility] Office: [phone] Pager:  [phone]

## 2014-04-24 ENCOUNTER — Other Ambulatory Visit: Payer: Self-pay | Admitting: Physician Assistant

## 2014-04-24 NOTE — Telephone Encounter (Signed)
I don't see Crestor in his medication list. Left message for pt to call back.

## 2014-04-24 NOTE — Telephone Encounter (Signed)
Spoke with pt, he states he was not on this medication for about a year. He states he saw a vascular doctor and he recommended he get back on this. Please advise.

## 2014-04-24 NOTE — Telephone Encounter (Signed)
Yes he absolutely needs to be on his Crestor. If he is willing call-in Crestor 10 mg 1 a day. #30 refill for 1 year. Be sure he was calling about the Crestor and not the Coumadin.

## 2014-04-25 ENCOUNTER — Encounter: Payer: Self-pay | Admitting: Emergency Medicine

## 2014-04-26 MED ORDER — ROSUVASTATIN CALCIUM 10 MG PO TABS: 10.0000 mg | ORAL_TABLET | Freq: Every day | ORAL | Status: DC

## 2014-04-26 NOTE — Telephone Encounter (Signed)
Rx in your box to sign. Pt wants me to mail it to him.

## 2014-04-26 NOTE — Telephone Encounter (Signed)
I will be back in town this weekende to sign. Thanks

## 2014-05-25 ENCOUNTER — Ambulatory Visit (INDEPENDENT_AMBULATORY_CARE_PROVIDER_SITE_OTHER): Payer: BLUE CROSS/BLUE SHIELD

## 2014-05-25 DIAGNOSIS — I82402 Acute embolism and thrombosis of unspecified deep veins of left lower extremity: Secondary | ICD-10-CM | POA: Diagnosis not present

## 2014-05-25 DIAGNOSIS — Z5181 Encounter for therapeutic drug level monitoring: Secondary | ICD-10-CM

## 2014-05-25 LAB — POCT INR: INR: 3.9

## 2014-06-15 ENCOUNTER — Ambulatory Visit (INDEPENDENT_AMBULATORY_CARE_PROVIDER_SITE_OTHER): Payer: BLUE CROSS/BLUE SHIELD | Admitting: *Deleted

## 2014-06-15 DIAGNOSIS — Z5181 Encounter for therapeutic drug level monitoring: Secondary | ICD-10-CM | POA: Diagnosis not present

## 2014-06-15 DIAGNOSIS — I82402 Acute embolism and thrombosis of unspecified deep veins of left lower extremity: Secondary | ICD-10-CM

## 2014-06-15 LAB — POCT INR: INR: 2.9

## 2014-06-18 ENCOUNTER — Other Ambulatory Visit: Payer: Self-pay | Admitting: Interventional Cardiology

## 2014-06-21 ENCOUNTER — Telehealth: Payer: Self-pay

## 2014-06-21 NOTE — Telephone Encounter (Signed)
Pt requesting refill on metoprolol succinate (TOPROL-XL) 25 MG 24 hr tablet [[id number]][[id number]] , states he needs the directions changed to take once a day

## 2014-06-21 NOTE — Telephone Encounter (Signed)
Can we change sig?

## 2014-06-22 MED ORDER — METOPROLOL SUCCINATE ER 25 MG PO TB24: 25.0000 mg | ORAL_TABLET | Freq: Every day | ORAL | Status: DC

## 2014-06-22 NOTE — Telephone Encounter (Signed)
Done

## 2014-06-22 NOTE — Telephone Encounter (Signed)
Notified pt on VM that Rx sent in.

## 2014-07-27 ENCOUNTER — Ambulatory Visit (INDEPENDENT_AMBULATORY_CARE_PROVIDER_SITE_OTHER): Payer: BLUE CROSS/BLUE SHIELD | Admitting: *Deleted

## 2014-07-27 DIAGNOSIS — Z5181 Encounter for therapeutic drug level monitoring: Secondary | ICD-10-CM | POA: Diagnosis not present

## 2014-07-27 DIAGNOSIS — I82402 Acute embolism and thrombosis of unspecified deep veins of left lower extremity: Secondary | ICD-10-CM | POA: Diagnosis not present

## 2014-07-27 LAB — POCT INR: INR: 4.6

## 2014-08-17 ENCOUNTER — Ambulatory Visit (INDEPENDENT_AMBULATORY_CARE_PROVIDER_SITE_OTHER): Payer: BLUE CROSS/BLUE SHIELD

## 2014-08-17 DIAGNOSIS — I82402 Acute embolism and thrombosis of unspecified deep veins of left lower extremity: Secondary | ICD-10-CM

## 2014-08-17 DIAGNOSIS — Z5181 Encounter for therapeutic drug level monitoring: Secondary | ICD-10-CM

## 2014-08-17 LAB — POCT INR: INR: 4.2

## 2014-09-21 ENCOUNTER — Ambulatory Visit (INDEPENDENT_AMBULATORY_CARE_PROVIDER_SITE_OTHER): Payer: BLUE CROSS/BLUE SHIELD | Admitting: *Deleted

## 2014-09-21 DIAGNOSIS — I82402 Acute embolism and thrombosis of unspecified deep veins of left lower extremity: Secondary | ICD-10-CM | POA: Diagnosis not present

## 2014-09-21 DIAGNOSIS — Z5181 Encounter for therapeutic drug level monitoring: Secondary | ICD-10-CM

## 2014-09-21 LAB — POCT INR: INR: 2.2

## 2014-10-13 ENCOUNTER — Other Ambulatory Visit: Payer: Self-pay | Admitting: Emergency Medicine

## 2014-10-19 ENCOUNTER — Ambulatory Visit (INDEPENDENT_AMBULATORY_CARE_PROVIDER_SITE_OTHER): Payer: BLUE CROSS/BLUE SHIELD | Admitting: Pharmacist

## 2014-10-19 DIAGNOSIS — I82402 Acute embolism and thrombosis of unspecified deep veins of left lower extremity: Secondary | ICD-10-CM

## 2014-10-19 DIAGNOSIS — Z5181 Encounter for therapeutic drug level monitoring: Secondary | ICD-10-CM | POA: Diagnosis not present

## 2014-10-19 LAB — POCT INR: INR: 2.5

## 2014-10-30 ENCOUNTER — Other Ambulatory Visit: Payer: Self-pay | Admitting: Emergency Medicine

## 2014-10-30 ENCOUNTER — Encounter: Payer: Self-pay | Admitting: Emergency Medicine

## 2014-11-02 ENCOUNTER — Telehealth: Payer: Self-pay | Admitting: *Deleted

## 2014-11-02 NOTE — Telephone Encounter (Signed)
[facility] [address] requesting refill for Warfarin 5 mg

## 2014-11-02 NOTE — Telephone Encounter (Signed)
Okay to call in refill for 1 year. Make sure patient is having regular INR checks.

## 2014-11-05 MED ORDER — WARFARIN SODIUM 5 MG PO TABS: ORAL_TABLET | ORAL | Status: DC

## 2014-11-05 NOTE — Telephone Encounter (Signed)
Rx sent 

## 2014-11-05 NOTE — Addendum Note (Signed)
Addended by: Cydney Ok on: 11/05/2014 09:37 AM   Modules accepted: Orders

## 2014-11-07 ENCOUNTER — Other Ambulatory Visit: Payer: Self-pay | Admitting: Emergency Medicine

## 2014-11-07 NOTE — Telephone Encounter (Signed)
Patient called to check the status of this request.

## 2015-04-05 ENCOUNTER — Ambulatory Visit (INDEPENDENT_AMBULATORY_CARE_PROVIDER_SITE_OTHER): Payer: BLUE CROSS/BLUE SHIELD | Admitting: *Deleted

## 2015-04-05 DIAGNOSIS — Z5181 Encounter for therapeutic drug level monitoring: Secondary | ICD-10-CM

## 2015-04-05 LAB — POCT INR: INR: 3.6

## 2015-04-29 ENCOUNTER — Other Ambulatory Visit: Payer: Self-pay | Admitting: Emergency Medicine

## 2015-05-03 ENCOUNTER — Ambulatory Visit (INDEPENDENT_AMBULATORY_CARE_PROVIDER_SITE_OTHER): Payer: BLUE CROSS/BLUE SHIELD | Admitting: Pharmacist

## 2015-05-03 DIAGNOSIS — Z5181 Encounter for therapeutic drug level monitoring: Secondary | ICD-10-CM | POA: Diagnosis not present

## 2015-05-03 LAB — POCT INR: INR: 3.1

## 2015-05-07 DIAGNOSIS — E291 Testicular hypofunction: Secondary | ICD-10-CM | POA: Diagnosis not present

## 2015-05-14 DIAGNOSIS — E291 Testicular hypofunction: Secondary | ICD-10-CM | POA: Diagnosis not present

## 2015-05-16 ENCOUNTER — Other Ambulatory Visit: Payer: Self-pay | Admitting: Emergency Medicine

## 2015-05-19 ENCOUNTER — Other Ambulatory Visit: Payer: Self-pay | Admitting: Interventional Cardiology

## 2015-05-21 DIAGNOSIS — M25562 Pain in left knee: Secondary | ICD-10-CM | POA: Diagnosis not present

## 2015-05-28 ENCOUNTER — Other Ambulatory Visit: Payer: Self-pay | Admitting: Physician Assistant

## 2015-06-11 ENCOUNTER — Ambulatory Visit (INDEPENDENT_AMBULATORY_CARE_PROVIDER_SITE_OTHER): Payer: BLUE CROSS/BLUE SHIELD | Admitting: Emergency Medicine

## 2015-06-11 VITALS — BP 122/80 | HR 71 | Temp 98.2°F | Resp 16 | Ht 69.5 in | Wt 242.4 lb

## 2015-06-11 DIAGNOSIS — I82402 Acute embolism and thrombosis of unspecified deep veins of left lower extremity: Secondary | ICD-10-CM | POA: Diagnosis not present

## 2015-06-11 DIAGNOSIS — Z125 Encounter for screening for malignant neoplasm of prostate: Secondary | ICD-10-CM

## 2015-06-11 DIAGNOSIS — I2510 Atherosclerotic heart disease of native coronary artery without angina pectoris: Secondary | ICD-10-CM

## 2015-06-11 DIAGNOSIS — R7989 Other specified abnormal findings of blood chemistry: Secondary | ICD-10-CM

## 2015-06-11 DIAGNOSIS — I10 Essential (primary) hypertension: Secondary | ICD-10-CM

## 2015-06-11 DIAGNOSIS — Z8639 Personal history of other endocrine, nutritional and metabolic disease: Secondary | ICD-10-CM

## 2015-06-11 DIAGNOSIS — Z0000 Encounter for general adult medical examination without abnormal findings: Secondary | ICD-10-CM

## 2015-06-11 DIAGNOSIS — E291 Testicular hypofunction: Secondary | ICD-10-CM

## 2015-06-11 DIAGNOSIS — E785 Hyperlipidemia, unspecified: Secondary | ICD-10-CM | POA: Diagnosis not present

## 2015-06-11 DIAGNOSIS — Z1159 Encounter for screening for other viral diseases: Secondary | ICD-10-CM

## 2015-06-11 DIAGNOSIS — M1000 Idiopathic gout, unspecified site: Secondary | ICD-10-CM

## 2015-06-11 DIAGNOSIS — Z8739 Personal history of other diseases of the musculoskeletal system and connective tissue: Secondary | ICD-10-CM

## 2015-06-11 LAB — COMPLETE METABOLIC PANEL WITH GFR
ALBUMIN: 4.2 g/dL (ref 3.6–5.1)
ALK PHOS: 59 U/L (ref 40–115)
ALT: 33 U/L (ref 9–46)
AST: 24 U/L (ref 10–35)
BILIRUBIN TOTAL: 0.7 mg/dL (ref 0.2–1.2)
BUN: 25 mg/dL (ref 7–25)
CO2: 24 mmol/L (ref 20–31)
CREATININE: 1.27 mg/dL — AB (ref 0.70–1.25)
Calcium: 8.9 mg/dL (ref 8.6–10.3)
Chloride: 101 mmol/L (ref 98–110)
GFR, EST AFRICAN AMERICAN: 70 mL/min (ref >=60)
GFR, EST NON AFRICAN AMERICAN: 61 mL/min (ref >=60)
Glucose, Bld: 98 mg/dL (ref 65–99)
Potassium: 4 mmol/L (ref 3.5–5.3)
Sodium: 139 mmol/L (ref 135–146)
TOTAL PROTEIN: 6.7 g/dL (ref 6.1–8.1)

## 2015-06-11 LAB — POCT URINALYSIS DIP (MANUAL ENTRY)
BILIRUBIN UA: NEGATIVE
Blood, UA: NEGATIVE
Glucose, UA: NEGATIVE
Ketones, POC UA: NEGATIVE
Leukocytes, UA: NEGATIVE
Nitrite, UA: NEGATIVE
PROTEIN UA: NEGATIVE
SPEC GRAV UA: 1.015
Urobilinogen, UA: 0.2
pH, UA: 5.5

## 2015-06-11 LAB — POCT GLYCOSYLATED HEMOGLOBIN (HGB A1C): Hemoglobin A1C: 5.4

## 2015-06-11 LAB — POC MICROSCOPIC URINALYSIS (UMFC): Mucus: ABSENT

## 2015-06-11 LAB — POCT CBC
Granulocyte percent: 56 %G (ref 37–80)
HEMATOCRIT: 49.3 % (ref 43.5–53.7)
Hemoglobin: 17.6 g/dL (ref 14.1–18.1)
LYMPH, POC: 2.6 (ref 0.6–3.4)
MCH, POC: 34.5 pg — AB (ref 27–31.2)
MCHC: 35.6 g/dL — AB (ref 31.8–35.4)
MCV: 96.9 fL (ref 80–97)
MID (CBC): 0.7 (ref 0–0.9)
MPV: 7.2 fL (ref 0–99.8)
POC GRANULOCYTE: 4.2 (ref 2–6.9)
POC LYMPH %: 34.1 % (ref 10–50)
POC MID %: 9.9 % (ref 0–12)
Platelet Count, POC: 132 10*3/uL — AB (ref 142–424)
RBC: 5.09 M/uL (ref 4.69–6.13)
RDW, POC: 14.4 %
WBC: 7.5 10*3/uL (ref 4.6–10.2)

## 2015-06-11 LAB — URIC ACID: URIC ACID, SERUM: 5 mg/dL (ref 4.0–7.8)

## 2015-06-11 LAB — LIPID PANEL
Cholesterol: 177 mg/dL (ref 125–200)
HDL: 60 mg/dL (ref >=40)
LDL CALC: 95 mg/dL (ref <130)
Total CHOL/HDL Ratio: 3 Ratio (ref <=5.0)
Triglycerides: 108 mg/dL (ref <150)
VLDL: 22 mg/dL (ref <30)

## 2015-06-11 LAB — TESTOSTERONE: TESTOSTERONE: 574 ng/dL (ref 250–827)

## 2015-06-11 LAB — TSH: TSH: 3.08 m[IU]/L (ref 0.40–4.50)

## 2015-06-11 LAB — HEPATITIS C ANTIBODY: HCV Ab: NEGATIVE

## 2015-06-11 MED ORDER — VALACYCLOVIR HCL 1 G PO TABS: ORAL_TABLET | ORAL | Status: DC

## 2015-06-11 MED ORDER — WARFARIN SODIUM 5 MG PO TABS: ORAL_TABLET | ORAL | Status: DC

## 2015-06-11 MED ORDER — METOPROLOL SUCCINATE ER 25 MG PO TB24: 25.0000 mg | ORAL_TABLET | Freq: Every day | ORAL | Status: DC

## 2015-06-11 MED ORDER — CLOMIPHENE CITRATE 50 MG PO TABS: ORAL_TABLET | ORAL | Status: DC

## 2015-06-11 MED ORDER — TADALAFIL 20 MG PO TABS: 10.0000 mg | ORAL_TABLET | ORAL | Status: DC | PRN

## 2015-06-11 MED ORDER — ROSUVASTATIN CALCIUM 10 MG PO TABS: 10.0000 mg | ORAL_TABLET | Freq: Every day | ORAL | Status: DC

## 2015-06-11 NOTE — Patient Instructions (Addendum)
   IF you received an x-ray today, you will receive an invoice from [facility]. Please contact [facility] at [phone] with questions or concerns regarding your invoice.   IF you received labwork today, you will receive an invoice from [facility]. Please contact [facility] at [phone] with questions or concerns regarding your invoice.   Our billing staff will not be able to assist you with questions regarding bills from these companies.  You will be contacted with the lab results as soon as they are available. The fastest way to get your results is to activate your My Chart account. Instructions are located on the last page of this paperwork. If you have not heard from us regarding the results in 2 weeks, please contact this office.    Health Maintenance, Male A healthy lifestyle and preventative care can promote health and wellness.  Maintain regular health, dental, and eye exams.  Eat a healthy diet. Foods like vegetables, fruits, whole grains, low-fat dairy products, and lean protein foods contain the nutrients you need and are low in calories. Decrease your intake of foods high in solid fats, added sugars, and salt. Get information about a proper diet from your health care provider, if necessary.  Regular physical exercise is one of the most important things you can do for your health. Most adults should get at least 150 minutes of moderate-intensity exercise (any activity that increases your heart rate and causes you to sweat) each week. In addition, most adults need muscle-strengthening exercises on 2 or more days a week.   Maintain a healthy weight. The body mass index (BMI) is a screening tool to identify possible weight problems. It provides an estimate of body fat based on height and weight. Your health care provider can find your BMI and can help you achieve or maintain a healthy weight. For males 20 years and older:  A BMI  below 18.5 is considered underweight.  A BMI of 18.5 to 24.9 is normal.  A BMI of 25 to 29.9 is considered overweight.  A BMI of 30 and above is considered obese.  Maintain normal blood lipids and cholesterol by exercising and minimizing your intake of saturated fat. Eat a balanced diet with plenty of fruits and vegetables. Blood tests for lipids and cholesterol should begin at age 20 and be repeated every 5 years. If your lipid or cholesterol levels are high, you are over age 50, or you are at high risk for heart disease, you may need your cholesterol levels checked more frequently.Ongoing high lipid and cholesterol levels should be treated with medicines if diet and exercise are not working.  If you smoke, find out from your health care provider how to quit. If you do not use tobacco, do not start.  Lung cancer screening is recommended for adults aged 55-80 years who are at high risk for developing lung cancer because of a history of smoking. A yearly low-dose CT scan of the lungs is recommended for people who have at least a 30-Yezenia Fredrick-year history of smoking and are current smokers or have quit within the past 15 years. A Dalton Molesworth year of smoking is smoking an average of 1 Rolene Andrades of cigarettes a day for 1 year (for example, a 30-Courtni Balash-year history of smoking could mean smoking 1 Xiana Carns a day for 30 years or 2 packs a day for 15 years). Yearly screening should continue until the smoker has stopped smoking for at least 15 years. Yearly screening should be stopped   for people who develop a health problem that would prevent them from having lung cancer treatment.  If you choose to drink alcohol, do not have more than 2 drinks per day. One drink is considered to be 12 oz (360 mL) of beer, 5 oz (150 mL) of wine, or 1.5 oz (45 mL) of liquor.  Avoid the use of street drugs. Do not share needles with anyone. Ask for help if you need support or instructions about stopping the use of drugs.  High blood pressure  causes heart disease and increases the risk of stroke. High blood pressure is more likely to develop in:  People who have blood pressure in the end of the normal range (100-139/85-89 mm Hg).  People who are overweight or obese.  People who are African American.  If you are 18-39 years of age, have your blood pressure checked every 3-5 years. If you are 40 years of age or older, have your blood pressure checked every year. You should have your blood pressure measured twice--once when you are at a hospital or clinic, and once when you are not at a hospital or clinic. Record the average of the two measurements. To check your blood pressure when you are not at a hospital or clinic, you can use:  An automated blood pressure machine at a pharmacy.  A home blood pressure monitor.  If you are 45-79 years old, ask your health care provider if you should take aspirin to prevent heart disease.  Diabetes screening involves taking a blood sample to check your fasting blood sugar level. This should be done once every 3 years after age 45 if you are at a normal weight and without risk factors for diabetes. Testing should be considered at a younger age or be carried out more frequently if you are overweight and have at least 1 risk factor for diabetes.  Colorectal cancer can be detected and often prevented. Most routine colorectal cancer screening begins at the age of 50 and continues through age 75. However, your health care provider may recommend screening at an earlier age if you have risk factors for colon cancer. On a yearly basis, your health care provider may provide home test kits to check for hidden blood in the stool. A small camera at the end of a tube may be used to directly examine the colon (sigmoidoscopy or colonoscopy) to detect the earliest forms of colorectal cancer. Talk to your health care provider about this at age 50 when routine screening begins. A direct exam of the colon should be repeated  every 5-10 years through age 75, unless early forms of precancerous polyps or small growths are found.  People who are at an increased risk for hepatitis B should be screened for this virus. You are considered at high risk for hepatitis B if:  You were born in a country where hepatitis B occurs often. Talk with your health care provider about which countries are considered high risk.  Your parents were born in a high-risk country and you have not received a shot to protect against hepatitis B (hepatitis B vaccine).  You have HIV or AIDS.  You use needles to inject street drugs.  You live with, or have sex with, someone who has hepatitis B.  You are a man who has sex with other men (MSM).  You get hemodialysis treatment.  You take certain medicines for conditions like cancer, organ transplantation, and autoimmune conditions.  Hepatitis C blood testing is recommended for   all people born from 1945 through 1965 and any individual with known risk factors for hepatitis C.  Healthy men should no longer receive prostate-specific antigen (PSA) blood tests as part of routine cancer screening. Talk to your health care provider about prostate cancer screening.  Testicular cancer screening is not recommended for adolescents or adult males who have no symptoms. Screening includes self-exam, a health care provider exam, and other screening tests. Consult with your health care provider about any symptoms you have or any concerns you have about testicular cancer.  Practice safe sex. Use condoms and avoid high-risk sexual practices to reduce the spread of sexually transmitted infections (STIs).  You should be screened for STIs, including gonorrhea and chlamydia if:  You are sexually active and are younger than 24 years.  You are older than 24 years, and your health care provider tells you that you are at risk for this type of infection.  Your sexual activity has changed since you were last screened,  and you are at an increased risk for chlamydia or gonorrhea. Ask your health care provider if you are at risk.  If you are at risk of being infected with HIV, it is recommended that you take a prescription medicine daily to prevent HIV infection. This is called pre-exposure prophylaxis (PrEP). You are considered at risk if:  You are a man who has sex with other men (MSM).  You are a heterosexual man who is sexually active with multiple partners.  You take drugs by injection.  You are sexually active with a partner who has HIV.  Talk with your health care provider about whether you are at high risk of being infected with HIV. If you choose to begin PrEP, you should first be tested for HIV. You should then be tested every 3 months for as long as you are taking PrEP.  Use sunscreen. Apply sunscreen liberally and repeatedly throughout the day. You should seek shade when your shadow is shorter than you. Protect yourself by wearing long sleeves, pants, a wide-brimmed hat, and sunglasses year round whenever you are outdoors.  Tell your health care provider of new moles or changes in moles, especially if there is a change in shape or color. Also, tell your health care provider if a mole is larger than the size of a pencil eraser.  A one-time screening for abdominal aortic aneurysm (AAA) and surgical repair of large AAAs by ultrasound is recommended for men aged 65-75 years who are current or former smokers.  Stay current with your vaccines (immunizations).   This information is not intended to replace advice given to you by your health care provider. Make sure you discuss any questions you have with your health care provider.   Document Released: 07/11/2007 Document Revised: 02/02/2014 Document Reviewed: 06/09/2010 Elsevier Interactive Patient Education 2016 Elsevier Inc.  

## 2015-06-11 NOTE — Progress Notes (Signed)
Subjective:  This chart was scribed for Roy Yang, by [name], at [facility].  This patient was seen in room  2  and the patient's care was started at 8:59 AM.   Chief Complaint  Patient presents with  . Annual Exam     Patient ID: Roy Yang, male    DOB: 12-18-1955, 60 y.o.   MRN: [id number]  HPI HPI Comments: Roy Yang is a 60 y.o. male who presents to the [facility] for an annual physical exam.  He has no recent changes in his medications.  Patient is currently trying to lose weight.  He has just gotten back from a cruise recently and has been travelling. Patient is married to the Landscape architect at [place]. He has two grandchildren. Patient has no complaints and states that everything has been going well.   Patient has been seeing his cardiologist yearly- there have been no concerns.  He had 1 stent put in and goes to the Coumadin clinic every couple weeks. Patient just had a repeat colonoscopy a couple of months ago. Patient last saw his eye doctor about a year ago.  He has not had any issues with his skin or skin cancer.      Patient Active Problem List   Diagnosis Date Noted  . Acute pulmonary embolism (HCC) 03/01/2014  . Encounter for therapeutic drug monitoring 12/26/2013  . History of DVT (deep vein thrombosis) 12/10/2013  . Hypogonadism male 09/12/2013  . CAD (coronary artery disease) 05/26/2011  . Gout 05/26/2011  . Hypertension 05/26/2011  . Hyperlipidemia 05/26/2011  . 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 CAD (coronary artery disease)   . Gout   . HSV-2 (herpes simplex virus 2) infection   . Hyperlipidemia   . Thyroid disease   . Clotting disorder (HCC)   . DVT, lower extremity, recurrent (HCC)   . PE (pulmonary embolism) 1998  . Depression   . Myocardial infarction (HCC)   . Hypertension    Past Surgical History  Procedure Laterality  Date  . Vasectomy    . Cosmetic surgery    . Thyroid surgery     No Known Allergies Prior to Admission medications   Medication Sig Start Date End Date Taking? Authorizing Provider  allopurinol (ZYLOPRIM) 300 MG tablet Take 300 mg by mouth daily.    Historical Provider, Yang  aspirin 81 MG tablet Take 81 mg by mouth daily.    Historical Provider, Yang  clomiPHENE (CLOMID) 50 MG tablet Take by mouth daily. 1/2 tab daily    Historical Provider, Yang  clomiPHENE (CLOMID) 50 MG tablet Take one half tablet daily 04/03/14   [name], Yang  metoprolol succinate (TOPROL-XL) 25 MG 24 hr tablet Take 1 tablet (25 mg total) by mouth daily. 06/22/14   [name], PA-C  rosuvastatin (CRESTOR) 10 MG tablet TAKE 1 TABLET BY MOUTH EVERY DAY 05/01/15   [name], PA-C  tadalafil (CIALIS) 20 MG tablet Take 0.5-1 tablets (10-20 mg total) by mouth every other day as needed for erectile dysfunction. 04/03/14   [name], Yang  valACYclovir (VALTREX) 1000 MG tablet Take 1 tablet (1,000 mg total) by mouth 2 (two) times daily. 04/03/14   [name], Yang  valACYclovir (VALTREX) 1000 MG tablet TAKE 1 TABLET (1,000 MG TOTAL) BY MOUTH 2 (TWO) TIMES DAILY. 11/02/14   [name]  Anaisa Radi, Yang  warfarin (COUMADIN) 5 MG tablet TAKE 1/2 TABLET BY MOUTH MONDAY, WEDNESDAY, AND FRIDAY THEN TAKE 1 TABLET BY MOUTH TUESDAY, THURS, SATURDAY,SUN. 05/20/15   Lyn RecordsHenry W Smith, Yang   Social History   Social History  . Marital Status: Single    Spouse Name: N/A  . Number of Children: N/A  . Years of Education: N/A   Occupational History  . Not on file.   Social History Main Topics  . Smoking status: Never Smoker   . Smokeless tobacco: Not on file  . Alcohol Use: 2.0 - 2.5 oz/week    4-5 Standard drinks or equivalent per week  . Drug Use: No  . Sexual Activity: Yes    Birth Control/ Protection: Abstinence   Other Topics Concern  . Not on file   Social History Narrative       Review of Systems  Constitutional: Negative for  fever and chills.  Eyes: Negative for pain, redness and itching.  Respiratory: Negative for cough, choking and shortness of breath.   Gastrointestinal: Negative for nausea and vomiting.  Musculoskeletal: Negative for neck pain and neck stiffness.  Skin: Negative for color change.  Neurological: Negative for syncope, speech difficulty and numbness.       Objective:   Physical Exam  Filed Vitals:   06/11/15 0826  BP: 122/80  Pulse: 71  Temp: 98.2 F (36.8 C)  TempSrc: Oral  Resp: 16  Height: 5' 9.5" (1.765 m)  Weight: 242 lb 6.4 oz (109.952 kg)  SpO2: 95%    CONSTITUTIONAL: Well developed/well nourished HEAD: Normocephalic/atraumatic EYES: EOMI/PERRL ENMT: Mucous membranes moist NECK: supple no meningeal signs SPINE/BACK:entire spine nontender CV: S1/S2 noted, no murmurs/rubs/gallops noted LUNGS: Lungs are clear to auscultation bilaterally, no apparent distress ABDOMEN: soft, nontender, no rebound or guarding, bowel sounds noted throughout abdomen GU:no cva tenderness NEURO: Pt is awake/alert/appropriate, moves all extremitiesx4.  No facial droop.   EXTREMITIES: pulses normal/equal, full ROM SKIN: warm, color normal PSYCH: no abnormalities of mood noted, alert and oriented to situation Results for orders placed or performed in visit on 06/11/15  POCT urinalysis dipstick  Result Value Ref Range   Color, UA yellow yellow   Clarity, UA clear clear   Glucose, UA negative negative   Bilirubin, UA negative negative   Ketones, POC UA negative negative   Spec Grav, UA 1.015    Blood, UA negative negative   pH, UA 5.5    Protein Ur, POC negative negative   Urobilinogen, UA 0.2    Nitrite, UA Negative Negative   Leukocytes, UA Negative Negative  POCT Microscopic Urinalysis ([facility])  Result Value Ref Range   WBC,UR,HPF,POC None None WBC/hpf   RBC,UR,HPF,POC None None RBC/hpf   Bacteria Few (A) None, Too numerous to count   Mucus Absent Absent   Epithelial Cells, UR Per  Microscopy None None, Too numerous to count cells/hpf  POCT CBC  Result Value Ref Range   WBC 7.5 4.6 - 10.2 K/uL   Lymph, poc 2.6 0.6 - 3.4   POC LYMPH PERCENT 34.1 10 - 50 %L   MID (cbc) 0.7 0 - 0.9   POC MID % 9.9 0 - 12 %M   POC Granulocyte 4.2 2 - 6.9   Granulocyte percent 56.0 37 - 80 %G   RBC 5.09 4.69 - 6.13 M/uL   Hemoglobin 17.6 14.1 - 18.1 g/dL   HCT, POC 16.149.3 09.643.5 - 53.7 %   MCV 96.9 80 - 97 fL  MCH, POC 34.5 (A) 27 - 31.2 pg   MCHC 35.6 (A) 31.8 - 35.4 g/dL   RDW, POC 16.1 %   Platelet Count, POC 132 (A) 142 - 424 K/uL   MPV 7.2 0 - 99.8 fL  POCT glycosylated hemoglobin (Hb A1C)  Result Value Ref Range   Hemoglobin A1C 5.4    Filed Weights   06/11/15 0826  Weight: 242 lb 6.4 oz (109.952 kg)      Assessment & Plan:  Patient doing well. He is up-to-date on colonoscopy. Meds were refilled. We'll review his immunizations. They appear to be up-to-date. I personally performed the services described in this documentation, which was scribed in my presence. The recorded information has been reviewed and is accurate. Patient given information regarding [facility]  [place] for primary care. [name], Yang

## 2015-06-12 LAB — PSA: PSA: 1.36 ng/mL (ref <=4.00)

## 2015-06-14 ENCOUNTER — Telehealth: Payer: Self-pay

## 2015-06-14 ENCOUNTER — Ambulatory Visit (INDEPENDENT_AMBULATORY_CARE_PROVIDER_SITE_OTHER): Payer: BLUE CROSS/BLUE SHIELD | Admitting: Pharmacist

## 2015-06-14 DIAGNOSIS — Z5181 Encounter for therapeutic drug level monitoring: Secondary | ICD-10-CM

## 2015-06-14 LAB — POCT INR: INR: 2.6

## 2015-06-14 NOTE — Telephone Encounter (Signed)
PA completed for Cialis on covermymeds. Pending. I don't see that the pt has tried anything other than Cialis in the past. Viagra is probably preferred and this PA may be denied but will wait for decision.

## 2015-06-17 ENCOUNTER — Other Ambulatory Visit: Payer: Self-pay | Admitting: Interventional Cardiology

## 2015-06-19 NOTE — Telephone Encounter (Signed)
I have not gotten a denial letter with details about what the preferred alternative is, but covermymeds shows a denial. Dr Cleta Albertsaub, do you want to try a Rx for Viagra? It is probably either the preferred brand, or nothing is covered.

## 2015-06-20 NOTE — Telephone Encounter (Signed)
Call patient and see if he would like me to call in  the generic sildenafil 20 mg and he could take 2 tablets prior to intercourse. This generic substitute is much cheaper than the regular Viagra. If he wants me to do this just let me know and I will send it to the pharmacy he chooses.

## 2015-06-25 NOTE — Telephone Encounter (Signed)
Called pt and he stated that he had reported at [facility]PE that he is not using ED meds anymore and so he does not need for us to send anything in. Dr Cleta Albertsaub, Lorain ChildesFYI, nothing needed.

## 2015-07-19 ENCOUNTER — Ambulatory Visit (INDEPENDENT_AMBULATORY_CARE_PROVIDER_SITE_OTHER): Payer: BLUE CROSS/BLUE SHIELD | Admitting: *Deleted

## 2015-07-19 DIAGNOSIS — Z5181 Encounter for therapeutic drug level monitoring: Secondary | ICD-10-CM

## 2015-07-19 LAB — POCT INR: INR: 2.2

## 2015-08-09 DIAGNOSIS — M25562 Pain in left knee: Secondary | ICD-10-CM | POA: Diagnosis not present

## 2015-09-05 ENCOUNTER — Telehealth: Payer: Self-pay | Admitting: *Deleted

## 2015-09-05 NOTE — Telephone Encounter (Signed)
Have left multiple messages for the Roy Yang to call & schedule an appt in the [facility] office since the Roy Yang declined making an appt in the office on 07/19/15. On 07/19/15, Roy Yang stated he needed to check his work schedule before he set his appt, however he has not returned a call to schedule & several attempts has been made to reach the Roy Yang.

## 2015-10-18 DIAGNOSIS — M25562 Pain in left knee: Secondary | ICD-10-CM | POA: Diagnosis not present

## 2015-10-29 DIAGNOSIS — M25562 Pain in left knee: Secondary | ICD-10-CM | POA: Diagnosis not present

## 2015-10-30 ENCOUNTER — Other Ambulatory Visit (HOSPITAL_COMMUNITY): Payer: Self-pay | Admitting: Orthopedic Surgery

## 2015-10-30 ENCOUNTER — Ambulatory Visit (HOSPITAL_COMMUNITY)
Admission: RE | Admit: 2015-10-30 | Discharge: 2015-10-30 | Disposition: A | Discharge status: Home / Self Care | Payer: BLUE CROSS/BLUE SHIELD | Source: Ambulatory Visit | Attending: Cardiovascular Disease | Admitting: Cardiovascular Disease

## 2015-10-30 DIAGNOSIS — I82532 Chronic embolism and thrombosis of left popliteal vein: Secondary | ICD-10-CM | POA: Diagnosis not present

## 2015-10-30 DIAGNOSIS — I82512 Chronic embolism and thrombosis of left femoral vein: Secondary | ICD-10-CM | POA: Diagnosis not present

## 2015-10-30 DIAGNOSIS — M7989 Other specified soft tissue disorders: Secondary | ICD-10-CM

## 2015-10-30 DIAGNOSIS — M79605 Pain in left leg: Secondary | ICD-10-CM | POA: Diagnosis not present

## 2015-10-30 DIAGNOSIS — M79604 Pain in right leg: Secondary | ICD-10-CM

## 2015-10-30 DIAGNOSIS — I10 Essential (primary) hypertension: Secondary | ICD-10-CM | POA: Diagnosis not present

## 2015-10-30 DIAGNOSIS — Z86718 Personal history of other venous thrombosis and embolism: Secondary | ICD-10-CM | POA: Insufficient documentation

## 2015-10-30 DIAGNOSIS — E785 Hyperlipidemia, unspecified: Secondary | ICD-10-CM | POA: Insufficient documentation

## 2015-10-31 ENCOUNTER — Telehealth: Payer: Self-pay | Admitting: Pharmacist

## 2015-10-31 NOTE — Telephone Encounter (Signed)
Received clearance from Dr. Eulah PontMurphy with Roy Yang that pt is pending a left knee scope with request to hold Coumadin.  Patient takes Coumadin for history of multiple DVTs. He had a DVT + PE in 1999, DVT in 2010, 2015, and 2016. 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 Given history of 5 DVTs and a PE, he would need bridging if he needs to come off Coumadin for an extended period of time.  However, pt is nonadherent to his Coumadin checks. He has not come in for a Coumadin check in the past 3 months, and has only had 4 INR checks in the last year. Not sure if he would be adherent with Lovenox injections either. Pt does not have any contraindications to DOAC therapy. This would be a good alternative given pt's history of nonadherence to visits. Would also eliminate need for Lovenox bridging.  Discussed with Dr Roy Yang and he agreed that we can switch pt to DOAC. Is ok with pt holding therapy for 24 hours prior to procedure. Called pt and he will come to clinic tomorrow to discuss switching to Xarelto (prefer over Eliquis d/t daily dosing and better chance for adherence).

## 2015-11-01 ENCOUNTER — Ambulatory Visit (INDEPENDENT_AMBULATORY_CARE_PROVIDER_SITE_OTHER): Payer: BLUE CROSS/BLUE SHIELD | Admitting: Pharmacist

## 2015-11-01 ENCOUNTER — Other Ambulatory Visit: Payer: Self-pay | Admitting: Pharmacist

## 2015-11-01 DIAGNOSIS — Z5181 Encounter for therapeutic drug level monitoring: Secondary | ICD-10-CM

## 2015-11-01 LAB — POCT INR: INR: 2.3

## 2015-11-01 MED ORDER — RIVAROXABAN (XARELTO) VTE STARTER PACK (15 & 20 MG): ORAL_TABLET | ORAL | 0 refills | Status: DC

## 2015-11-01 MED ORDER — RIVAROXABAN 20 MG PO TABS: 20.0000 mg | ORAL_TABLET | Freq: Every day | ORAL | 11 refills | Status: DC

## 2015-11-22 NOTE — Telephone Encounter (Signed)
Received a phone call from surgeons office. Needing an appointment scheduled for clearance of surgery. Pt has not been here since May. Attempted to call pt. No answer. Voice mail left this pt ws a patient of Dr. Cleta Albertsaub

## 2015-11-25 ENCOUNTER — Ambulatory Visit (INDEPENDENT_AMBULATORY_CARE_PROVIDER_SITE_OTHER): Payer: BLUE CROSS/BLUE SHIELD | Admitting: Family Medicine

## 2015-11-25 VITALS — BP 128/76 | HR 73 | Temp 98.3°F | Resp 16 | Ht 70.0 in | Wt 239.0 lb

## 2015-11-25 DIAGNOSIS — Z01818 Encounter for other preprocedural examination: Secondary | ICD-10-CM

## 2015-11-25 DIAGNOSIS — Z7901 Long term (current) use of anticoagulants: Secondary | ICD-10-CM

## 2015-11-25 DIAGNOSIS — I10 Essential (primary) hypertension: Secondary | ICD-10-CM

## 2015-11-25 DIAGNOSIS — I2510 Atherosclerotic heart disease of native coronary artery without angina pectoris: Secondary | ICD-10-CM | POA: Diagnosis not present

## 2015-11-25 LAB — CBC WITH DIFFERENTIAL/PLATELET
BASOS PCT: 0 %
Basophils Absolute: 0 cells/uL (ref 0–200)
EOS PCT: 2 %
Eosinophils Absolute: 162 cells/uL (ref 15–500)
HCT: 46.9 % (ref 38.5–50.0)
Hemoglobin: 16 g/dL (ref 13.2–17.1)
LYMPHS ABS: 2592 {cells}/uL (ref 850–3900)
Lymphocytes Relative: 32 %
MCH: 34.3 pg — ABNORMAL HIGH (ref 27.0–33.0)
MCHC: 34.1 g/dL (ref 32.0–36.0)
MCV: 100.6 fL — ABNORMAL HIGH (ref 80.0–100.0)
MONOS PCT: 10 %
MPV: 9.9 fL (ref 7.5–12.5)
Monocytes Absolute: 810 cells/uL (ref 200–950)
NEUTROS ABS: 4536 {cells}/uL (ref 1500–7800)
Neutrophils Relative %: 56 %
PLATELETS: 181 10*3/uL (ref 140–400)
RBC: 4.66 MIL/uL (ref 4.20–5.80)
RDW: 13.8 % (ref 11.0–15.0)
WBC: 8.1 10*3/uL (ref 3.8–10.8)

## 2015-11-25 LAB — COMPLETE METABOLIC PANEL WITH GFR
ALBUMIN: 4.1 g/dL (ref 3.6–5.1)
ALK PHOS: 43 U/L (ref 40–115)
ALT: 20 U/L (ref 9–46)
AST: 21 U/L (ref 10–35)
BILIRUBIN TOTAL: 0.5 mg/dL (ref 0.2–1.2)
BUN: 20 mg/dL (ref 7–25)
CO2: 26 mmol/L (ref 20–31)
Calcium: 9.2 mg/dL (ref 8.6–10.3)
Chloride: 105 mmol/L (ref 98–110)
Creat: 1.26 mg/dL — ABNORMAL HIGH (ref 0.70–1.25)
GFR, EST AFRICAN AMERICAN: 71 mL/min (ref >=60)
GFR, EST NON AFRICAN AMERICAN: 61 mL/min (ref >=60)
Glucose, Bld: 83 mg/dL (ref 65–99)
POTASSIUM: 3.9 mmol/L (ref 3.5–5.3)
Sodium: 140 mmol/L (ref 135–146)
TOTAL PROTEIN: 6.5 g/dL (ref 6.1–8.1)

## 2015-11-25 LAB — LIPID PANEL
CHOL/HDL RATIO: 3.2 ratio (ref <=5.0)
CHOLESTEROL: 149 mg/dL (ref 125–200)
HDL: 46 mg/dL (ref >=40)
LDL Cholesterol: 79 mg/dL (ref <130)
TRIGLYCERIDES: 119 mg/dL (ref <150)
VLDL: 24 mg/dL (ref <30)

## 2015-11-25 NOTE — Patient Instructions (Signed)
     IF you received an x-ray today, you will receive an invoice from [facility]. Please contact [facility] at [phone] with questions or concerns regarding your invoice.   IF you received labwork today, you will receive an invoice from [facility]. Please contact [facility] at [phone] with questions or concerns regarding your invoice.   Our billing staff will not be able to assist you with questions regarding bills from these companies.  You will be contacted with the lab results as soon as they are available. The fastest way to get your results is to activate your My Chart account. Instructions are located on the last page of this paperwork. If you have not heard from us regarding the results in 2 weeks, please contact this office.      

## 2015-11-25 NOTE — Progress Notes (Signed)
Patient ID: Roy SarinJeffrey A Yang, male    DOB: 12/11/1955, 60 y.o.   MRN: [id number]  PCP: Lucilla EdinAUB, STEVE A, MD  Chief Complaint  Patient presents with  . surgical clearance    pt having knee surgery    Subjective:   HPI 60 year old male presents for  surgical clearance exam for left knee surgery. Patient is an [facility]. He report the surgery will be completed by Dr. Delbert HarnessMurphy Wainer and form was faxed several weeks prior. His chronic medical conditions include, chronic anticoagulant therapy for DVT prevention with Xarelto. He continues to be monitored at the Coumadin clinic for INR checks as he was recently bridged from Coumadin to Xarelto. Other chronic conditions include CAD, controlled hypertension (metoprolol 25 mg daily) , hyperlipidemia (rosuvastatin 10 mg daily), gout (allopurinol 300 mg daily) and hypogonadism (Clomid). Denies any chest pain, shortness of breath, or headaches. He is followed by Dr. Verdis PrimeHenry Smith, cardiologist. Most recently he did obtain a bilateral venous doppler which showed a chronic left DVT.    Review of Systems See HPI   Patient Active Problem List   Diagnosis Date Noted  . Acute pulmonary embolism (HCC) 03/01/2014  . Encounter for therapeutic drug monitoring 12/26/2013  . History of DVT (deep vein thrombosis) 12/10/2013  . Hypogonadism male 09/12/2013  . CAD (coronary artery disease) 05/26/2011  . Gout 05/26/2011  . Hypertension 05/26/2011  . Hyperlipidemia 05/26/2011  . 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 Authorizing Provider  allopurinol (ZYLOPRIM) 300 MG tablet Take 300 mg by mouth daily.   Yes Historical Provider, MD  clomiPHENE (CLOMID) 50 MG tablet Take one half tablet daily 06/11/15  Yes Collene GobbleSteven A Daub, MD  metoprolol succinate (TOPROL-XL) 25 MG 24 hr tablet Take 1 tablet (25 mg total) by mouth daily. 06/11/15  Yes Collene GobbleSteven A Daub,  MD  rivaroxaban (XARELTO) 20 MG TABS tablet Take 1 tablet (20 mg total) by mouth daily with supper. 11/01/15  Yes Lyn RecordsHenry W Smith, MD  Rivaroxaban 15 & 20 MG TBPK Start with one 15mg  tablet by mouth twice a day with food. On Day 22, switch to one 20mg  tablet once a day with food. 11/01/15  Yes Lyn RecordsHenry W Smith, MD  rosuvastatin (CRESTOR) 10 MG tablet Take 1 tablet (10 mg total) by mouth daily. 06/11/15  Yes Collene GobbleSteven A Daub, MD  valACYclovir (VALTREX) 1000 MG tablet TAKE 1 TABLET (1,000 MG TOTAL) BY MOUTH 2 (TWO) TIMES DAILY. 06/11/15  Yes Collene GobbleSteven A Daub, MD    No Known Allergies     Objective:  Physical Exam  Constitutional: He is oriented to person, place, and time. He appears well-developed and well-nourished.  HENT:  Head: Normocephalic and atraumatic.  Right Ear: External ear normal.  Left Ear: External ear normal.  Nose: Nose normal.  Mouth/Throat: Oropharynx is clear and moist.  Eyes: Conjunctivae and EOM are normal. Pupils are equal, round, and reactive to light.  Neck: Normal range of motion. Neck supple. No tracheal deviation present. No thyromegaly present.  Cardiovascular: Normal rate, regular rhythm, normal heart sounds and intact distal pulses.   Pulmonary/Chest: Effort normal and breath sounds normal.  Abdominal: Soft. Bowel sounds are normal.  Musculoskeletal: Normal range of motion.  Lymphadenopathy:    He has no cervical adenopathy.  Neurological: He is alert and oriented to person, place, and time.  Skin: Skin is warm and  dry.  Psychiatric: He has a normal mood and affect. His behavior is normal. Judgment and thought content normal.     Vitals:   11/25/15 1351  BP: 128/76  Pulse: 73  Resp: 16  Temp: 98.3 F (36.8 C)   Assessment & Plan:  1. Encounter for preoperative examination for general surgical procedure 2. Essential hypertension - COMPLETE METABOLIC PANEL WITH GFR - CBC with Differential/Platelet - Lipid panel  3. Long-term (current) use of  anticoagulants  4. Coronary artery disease without angina pectoris, unspecified vessel or lesion type, unspecified whether native or transplanted heart  Surgical clearance letter completed and faxed to Dr. Janeece FittingMurphy Wainer's office.   Godfrey PickKimberly S. Tiburcio PeaHarris, MSN, FNP-C [facility]

## 2015-12-03 ENCOUNTER — Telehealth: Payer: Self-pay | Admitting: Pharmacist

## 2015-12-03 NOTE — Telephone Encounter (Signed)
Pt scheduled for Xarelto 1 month follow up this week. He called to let us know that he had lab work done last week at a different office. Labs from 10/30: SCr 1.26, CrCl 7694mL/min. Hgb 16, Hct 46.9. Labs stable and pt on appropriate dose of Xarelto 20mg  daily. He has already finished his 21 days of 15mg  BID. Reports no issues or side effects. Will continue on Xarelto 20mg  long term given hx of 5 DVTs and a PE - most recent DVT last month while pt was therapeutic on Coumadin. Scheduled next Xarelto f/u in 6 months.

## 2015-12-31 DIAGNOSIS — M25551 Pain in right hip: Secondary | ICD-10-CM | POA: Diagnosis not present

## 2015-12-31 NOTE — H&P (Signed)
Roy Yang comes back in for his left knee.  Seen and evaluated in April.  Injection we did was great, lasted three months.  It is now wearing off and he is getting some recurrent symptoms.  He comes in to see if we can do another injection.  We have discussed his previous workup, concerns about meniscus tear and the underlying arthritis.  As long as shots are used judiciously and they last for awhile I don't mind doing that, but if that is not the case the next step is an MRI to look at what his options are.  He will let me know.    EXAMINATION: General exam is outlined.  Specific exam reveals that he still has a good range of motion, left knee.  Tender medial joint line, as well as lateral and popping lateral greater than medial.  Some patellofemoral crepitus.  No evidence of infection or DVT.    DISPOSITION:  We are going to go ahead and repeat his Cortisone.  He will let me know with plans as outlined above.    PROCEDURE NOTE: The patient's clinical condition is marked by substantial pain and/or significant functional disability.  Other conservative therapy has not provided relief, is contraindicated, or not appropriate.  There is a reasonable likelihood that injection will significantly improve the patient's pain and/or functional disability. After appropriate consent and under sterile technique intraarticular injection of the left knee with 80 mg of Depo-Medrol and Marcaine.    Addendum: Roy Yang comes in for follow up.  MRI completed.  Torn medial meniscus, as expected.  No instability.  They do talk about some mucoid degeneration in the ACL, but this is not clinically significant.  All of his symptoms consistent with a meniscus tear.  Sufficient symptoms to warrant treatment.  I spent a good 25 minutes with him today face-to-face discussing options.  We are going to proceed with exam under anesthesia, arthroscopy and care of his meniscus tear.  Fortunately there are not a lot of degenerative changes  so I think his long-term prognosis is good.  Underlying concerning factors include a history of DVT in this leg and possibly in the other leg.  He also has a history of PE.  As a result he has been on long-term blood thinners, including Coumadin.  I am going to get a pre-op Doppler of both legs to see where we stand now.  We are also going to have to talk with his treating physician about what we can do with his Coumadin management perioperatively.  He certainly is at increased risk for DVT, but unfortunately his symptoms have gotten to a point where we really need to consider doing something to help his knee.  After appropriate clearances we will proceed.  I have gone through the procedure, risks, benefits and complications.  Filled out all of the paperwork.

## 2016-01-07 ENCOUNTER — Telehealth: Payer: Self-pay | Admitting: Internal Medicine

## 2016-01-07 NOTE — Telephone Encounter (Signed)
Patient was under dr Cleta Albertsdaub-- asking to become your patient since he is retiring. Is this something that you can do? ?

## 2016-01-08 NOTE — Telephone Encounter (Signed)
yes

## 2016-01-16 ENCOUNTER — Ambulatory Visit (HOSPITAL_BASED_OUTPATIENT_CLINIC_OR_DEPARTMENT_OTHER): Admit: 2016-01-16 | Payer: BLUE CROSS/BLUE SHIELD | Admitting: Orthopedic Surgery

## 2016-01-16 ENCOUNTER — Encounter (HOSPITAL_BASED_OUTPATIENT_CLINIC_OR_DEPARTMENT_OTHER): Payer: Self-pay

## 2016-01-16 SURGERY — ARTHROSCOPY, KNEE, WITH MEDIAL MENISCECTOMY
Anesthesia: Choice | Site: Knee | Laterality: Left

## 2016-01-30 ENCOUNTER — Encounter: Payer: Self-pay | Admitting: Sports Medicine

## 2016-02-04 ENCOUNTER — Ambulatory Visit (INDEPENDENT_AMBULATORY_CARE_PROVIDER_SITE_OTHER): Payer: BLUE CROSS/BLUE SHIELD | Admitting: Sports Medicine

## 2016-02-04 ENCOUNTER — Encounter: Payer: Self-pay | Admitting: Sports Medicine

## 2016-02-04 VITALS — BP 130/80 | Ht 70.0 in | Wt 234.0 lb

## 2016-02-04 DIAGNOSIS — G8929 Other chronic pain: Secondary | ICD-10-CM

## 2016-02-04 DIAGNOSIS — M25562 Pain in left knee: Secondary | ICD-10-CM | POA: Diagnosis not present

## 2016-02-04 DIAGNOSIS — M169 Osteoarthritis of hip, unspecified: Secondary | ICD-10-CM | POA: Insufficient documentation

## 2016-02-04 DIAGNOSIS — M23307 Other meniscus derangements, unspecified meniscus, left knee: Secondary | ICD-10-CM | POA: Diagnosis not present

## 2016-02-04 DIAGNOSIS — M1611 Unilateral primary osteoarthritis, right hip: Secondary | ICD-10-CM

## 2016-02-04 DIAGNOSIS — M25551 Pain in right hip: Secondary | ICD-10-CM | POA: Diagnosis not present

## 2016-02-04 MED ORDER — TRAMADOL HCL 50 MG PO TABS: 50.0000 mg | ORAL_TABLET | Freq: Three times a day (TID) | ORAL | 2 refills | Status: DC

## 2016-02-04 NOTE — Progress Notes (Signed)
CC;  RT hip pain/ Left Knee pain  Hx of worse left knee pain past 6 mos Seen at [facility] CSI helped 1 time Second [facility]SI only partially helpful Had MRI that showed meniscal tearing and some meniscal degeneration  ROS Some swelling No locking No giving way  Since issues with knee has 2 mos of sharp RT hip pain XR showed some arthritic change Not using meds as he has hx of PE and is on Xarleto  Past HX CAD PE and DVT  PE Pleasant Male NAD BP 130/80   Ht 5\' 10"  (1.778 m)   Wt 234 lb (106.1 kg)   BMI 33.58 kg/m   RT hip 15 deg IR and 30 deg ER Hip flexion is OK Weakness in hip abduction Lt hip has normal ROM at ~ 65 deg  Knee: Normal to inspection with no erythema  Mild effusion nut no obvious bony abnormalities. Palpation normal with no warmth or joint line tenderness or patellar tenderness or condyle tenderness. ROM normal in flexion and extension and lower leg rotation. Pain with flexion past 135 Ligaments with solid consistent endpoints including ACL, PCL, LCL, MCL. Negative Mcmurray's and provocative meniscal tests. Does get clicking Non painful patellar compression. Patellar and quadriceps tendons unremarkable. Hamstring and quadriceps strength is normal.  Gait: mild trendelenberg noted

## 2016-02-06 ENCOUNTER — Encounter: Payer: Self-pay | Admitting: Internal Medicine

## 2016-02-06 ENCOUNTER — Ambulatory Visit (INDEPENDENT_AMBULATORY_CARE_PROVIDER_SITE_OTHER): Payer: BLUE CROSS/BLUE SHIELD | Admitting: Internal Medicine

## 2016-02-06 VITALS — BP 138/78 | HR 74 | Temp 98.1°F | Resp 16 | Ht 70.0 in | Wt 243.0 lb

## 2016-02-06 DIAGNOSIS — G5623 Lesion of ulnar nerve, bilateral upper limbs: Secondary | ICD-10-CM | POA: Diagnosis not present

## 2016-02-06 NOTE — Patient Instructions (Signed)
Carpal Tunnel Syndrome Carpal tunnel syndrome is a condition that causes pain in your hand and arm. The carpal tunnel is a narrow area located on the palm side of your wrist. Repeated wrist motion or certain diseases may cause swelling within the tunnel. This swelling pinches the main nerve in the wrist (median nerve). What are the causes? This condition may be caused by:  Repeated wrist motions.  Wrist injuries.  Arthritis.  A cyst or tumor in the carpal tunnel.  Fluid buildup during pregnancy.  Sometimes the cause of this condition is not known. What increases the risk? This condition is more likely to develop in:  People who have jobs that cause them to repeatedly move their wrists in the same motion, such as butchers and cashiers.  Women.  People with certain conditions, such as: ? Diabetes. ? Obesity. ? An underactive thyroid (hypothyroidism). ? Kidney failure.  What are the signs or symptoms? Symptoms of this condition include:  A tingling feeling in your fingers, especially in your thumb, index, and middle fingers.  Tingling or numbness in your hand.  An aching feeling in your entire arm, especially when your wrist and elbow are bent for long periods of time.  Wrist pain that goes up your arm to your shoulder.  Pain that goes down into your palm or fingers.  A weak feeling in your hands. You may have trouble grabbing and holding items.  Your symptoms may feel worse during the night. How is this diagnosed? This condition is diagnosed with a medical history and physical exam. You may also have tests, including:  An electromyogram (EMG). This test measures electrical signals sent by your nerves into the muscles.  X-rays.  How is this treated? Treatment for this condition includes:  Lifestyle changes. It is important to stop doing or modify the activity that caused your condition.  Physical or occupational therapy.  Medicines for pain and inflammation.  This may include medicine that is injected into your wrist.  A wrist splint.  Surgery.  Follow these instructions at home: If you have a splint:  Wear it as told by your health care provider. Remove it only as told by your health care provider.  Loosen the splint if your fingers become numb and tingle, or if they turn cold and blue.  Keep the splint clean and dry. General instructions  Take over-the-counter and prescription medicines only as told by your health care provider.  Rest your wrist from any activity that may be causing your pain. If your condition is work related, talk to your employer about changes that can be made, such as getting a wrist pad to use while typing.  If directed, apply ice to the painful area: ? Put ice in a plastic bag. ? Place a towel between your skin and the bag. ? Leave the ice on for 20 minutes, 2-3 times per day.  Keep all follow-up visits as told by your health care provider. This is important.  Do any exercises as told by your health care provider, physical therapist, or occupational therapist. Contact a health care provider if:  You have new symptoms.  Your pain is not controlled with medicines.  Your symptoms get worse. This information is not intended to replace advice given to you by your health care provider. Make sure you discuss any questions you have with your health care provider. Document Released: 01/10/2000 Document Revised: 05/23/2015 Document Reviewed: 05/30/2014 Elsevier Interactive Patient Education  2017 [employer]vier Inc.  

## 2016-02-06 NOTE — Progress Notes (Signed)
Pre visit review using our clinic review tool, if applicable. No additional management support is needed unless otherwise documented below in the visit note. 

## 2016-02-06 NOTE — Progress Notes (Signed)
Subjective:  Patient ID: Roy Yang, male    DOB: 1955/09/25  Age: 61 y.o. MRN: [id number]  CC: Numbness  NEW TO ME  HPI Roy Yang presents for concerns about worsening numbness and tingling in both hands, worse on the right, isolated to the fourth and fifth fingers. This has been progressing over the last 3 years. He was told prior to his previous primary care physician that he probably had ulnar neuropathy but he did want to pursue diagnostic or treatment options until now.  History Roy Yang has a past medical history of CAD (coronary artery disease); Clotting disorder (HCC); Depression; DVT, lower extremity, recurrent (HCC); Gout; HSV-2 (herpes simplex virus 2) infection; Hyperlipidemia; Hypertension; Myocardial infarction; PE (pulmonary embolism) (1998); and Thyroid disease.   He has a past surgical history that includes Vasectomy; Cosmetic surgery; and Thyroid surgery.   His family history includes Atrial fibrillation in his father and mother; Cancer in his father and mother; Heart disease in his brother and father; Hyperlipidemia in his father and mother; Hypertension in his father; Peripheral vascular disease in his father.He reports that he has never smoked. He has never used smokeless tobacco. He reports that he drinks about 2.0 - 2.5 oz of alcohol per week . He reports that he does not use drugs.  Outpatient Medications Prior to Visit  Medication Sig Dispense Refill  . allopurinol (ZYLOPRIM) 300 MG tablet Take 300 mg by mouth daily.    . clomiPHENE (CLOMID) 50 MG tablet Take one half tablet daily 30 tablet 11  . metoprolol succinate (TOPROL-XL) 25 MG 24 hr tablet Take 1 tablet (25 mg total) by mouth daily. 90 tablet 3  . rivaroxaban (XARELTO) 20 MG TABS tablet Take 1 tablet (20 mg total) by mouth daily with supper. 30 tablet 11  . rosuvastatin (CRESTOR) 10 MG tablet Take 1 tablet (10 mg total) by mouth daily. 30 tablet 11  . traMADol (ULTRAM) 50 MG tablet Take 1 tablet  (50 mg total) by mouth 3 (three) times daily. 90 tablet 2  . valACYclovir (VALTREX) 1000 MG tablet TAKE 1 TABLET (1,000 MG TOTAL) BY MOUTH 2 (TWO) TIMES DAILY. 60 tablet 11  . XARELTO STARTER PACK 15 & 20 MG TBPK START WITH 1 15MG  TABLET BY MOUTH TWICE DAILY WITH FOOD. ON DAY 22 SWITCH TO 1 20MG  TABLET BY MOUTH ONCE DAILY WITH FOOD  0   No facility-administered medications prior to visit.     ROS Review of Systems  Constitutional: Negative.   HENT: Negative.   Eyes: Negative.  Negative for visual disturbance.  Respiratory: Negative for chest tightness, shortness of breath and wheezing.   Cardiovascular: Negative for chest pain, palpitations and leg swelling.  Gastrointestinal: Negative for abdominal pain, constipation, diarrhea and vomiting.  Endocrine: Negative.   Genitourinary: Negative.  Negative for difficulty urinating.  Musculoskeletal: Negative.  Negative for back pain, myalgias and neck pain.  Skin: Negative.   Allergic/Immunologic: Negative.   Neurological: Positive for numbness. Negative for dizziness, weakness and headaches.  Hematological: Negative.  Negative for adenopathy. Does not bruise/bleed easily.  Psychiatric/Behavioral: Negative.     Objective:  BP 138/78 (BP Location: Left Arm, Patient Position: Sitting, Cuff Size: Large)   Pulse 74   Temp 98.1 F (36.7 C) (Oral)   Resp 16   Ht 5\' 10"  (1.778 m)   Wt 243 lb (110.2 kg)   SpO2 94%   BMI 34.87 kg/m   Physical Exam  Constitutional: He is oriented to person, place,  and time. No distress.  HENT:  Mouth/Throat: Oropharynx is clear and moist. No oropharyngeal exudate.  Eyes: Conjunctivae are normal. Right eye exhibits no discharge. Left eye exhibits no discharge. No scleral icterus.  Neck: Normal range of motion. Neck supple. No JVD present. No tracheal deviation present. No thyromegaly present.  Cardiovascular: Normal rate, regular rhythm, normal heart sounds and intact distal pulses.  Exam reveals no gallop  and no friction rub.   No murmur heard. Pulmonary/Chest: Effort normal and breath sounds normal. No stridor. No respiratory distress. He has no wheezes. He has no rales. He exhibits no tenderness.  Abdominal: Soft. Bowel sounds are normal. He exhibits no distension and no mass. There is no tenderness. There is no rebound and no guarding.  Musculoskeletal: Normal range of motion. He exhibits no edema, tenderness or deformity.  Lymphadenopathy:    He has no cervical adenopathy.  Neurological: He is oriented to person, place, and time. He has normal strength. He displays no atrophy, no tremor and normal reflexes. No cranial nerve deficit. He exhibits normal muscle tone. He displays a negative Romberg sign. He displays no seizure activity. Coordination and gait normal.  Reflex Scores:      Tricep reflexes are 0 on the right side and 0 on the left side.      Bicep reflexes are 0 on the right side and 0 on the left side.      Brachioradialis reflexes are 0 on the right side and 0 on the left side.      Patellar reflexes are 1+ on the right side and 1+ on the left side.      Achilles reflexes are 0 on the right side and 0 on the left side. He has mild Dupuytren contractions on the palmar surfaces of both hands but no muscular atrophy  Skin: Skin is warm and dry. No rash noted. He is not diaphoretic. No erythema. No pallor.  Vitals reviewed.   Lab Results  Component Value Date   WBC 8.1 11/25/2015   HGB 16.0 11/25/2015   HCT 46.9 11/25/2015   PLT 181 11/25/2015   GLUCOSE 83 11/25/2015   CHOL 149 11/25/2015   TRIG 119 11/25/2015   HDL 46 11/25/2015   LDLCALC 79 11/25/2015   ALT 20 11/25/2015   AST 21 11/25/2015   NA 140 11/25/2015   K 3.9 11/25/2015   CL 105 11/25/2015   CREATININE 1.26 (H) 11/25/2015   BUN 20 11/25/2015   CO2 26 11/25/2015   TSH 3.08 06/11/2015   PSA 1.36 06/11/2015   INR 2.3 11/01/2015   HGBA1C 5.4 06/11/2015    Assessment & Plan:   Roy Yang was seen today for  numbness.  Diagnoses and all orders for this visit:  Ulnar neuropathy of both upper extremities- I have ordered a NCS/EMG to confirm the diagnosis of ulnar neuropathy, to gauge the severity, and to begin to think about treatment options. -     Ambulatory referral to Neurology   I have discontinued Roy Yang's XARELTO STARTER PACK. I am also having him maintain his allopurinol, rosuvastatin, metoprolol succinate, valACYclovir, clomiPHENE, rivaroxaban, and traMADol.  No orders of the defined types were placed in this encounter.    Follow-up: Return if symptoms worsen or fail to improve.  Sanda Lingerhomas Alamin Mccuiston, MD

## 2016-02-10 ENCOUNTER — Other Ambulatory Visit: Payer: Self-pay | Admitting: *Deleted

## 2016-02-10 DIAGNOSIS — G5623 Lesion of ulnar nerve, bilateral upper limbs: Secondary | ICD-10-CM

## 2016-02-13 ENCOUNTER — Encounter: Payer: BLUE CROSS/BLUE SHIELD | Admitting: Neurology

## 2016-02-14 DIAGNOSIS — M25562 Pain in left knee: Secondary | ICD-10-CM | POA: Diagnosis not present

## 2016-02-20 ENCOUNTER — Ambulatory Visit (INDEPENDENT_AMBULATORY_CARE_PROVIDER_SITE_OTHER): Payer: BLUE CROSS/BLUE SHIELD | Admitting: Neurology

## 2016-02-20 ENCOUNTER — Encounter: Payer: Self-pay | Admitting: Internal Medicine

## 2016-02-20 DIAGNOSIS — G5623 Lesion of ulnar nerve, bilateral upper limbs: Secondary | ICD-10-CM | POA: Diagnosis not present

## 2016-02-20 NOTE — Procedures (Signed)
[facility]  [address] [place], [address]  [place], [place] [address] Tel: [phone] Fax:  [phone] Test Date:  02/20/2016  Patient: Roy Yang DOB: [birth year] Physician: Nita Sickle, DO  Sex: Male Height: 5\' 10"  Ref Phys: Sanda Linger, M.D.  ID#: [id number] Temp: 36.6C Technician: Judie Petit. Dean   Patient Complaints: This is a 61 year old gentleman referred for evaluation of bilateral numbness involving the fourth and fifth digits, worse on the right.   NCV & EMG Findings: Extensive electrodiagnostic testing of the right upper extremity and additional studies of the left shows:  1. Bilateral median, ulnar, mixed palmer, and right dorsal ulnar cutaneous sensory response is within normal limits. 2. Bilateral median motor responses are within normal limits. Bilateral ulnar motor responses show mildly slowed conduction velocity across the elbow (A Elbow-B Elbow, L48, R43 m/s), with normal latency.  There is evidence of conduction block across the elbow on the right. 3. There is no evidence of active or chronic motor axon loss changes affecting any of the tested muscles. Motor unit configuration and recruitment pattern is within normal limits.  Impression: 1. Bilateral ulnar neuropathy with slowing across the elbow, purely demyelinating in type.  Overall, these findings are mild in degree electrically and worse on the right. 2. There is no evidence of cervical radiculopathy or carpal tunnel syndrome affecting the upper extremities.   ___________________________ Nita Sickle, DO    Nerve Conduction Studies Anti Sensory Summary Table   Stim Site NR Peak (ms) Norm Peak (ms) P-T Amp (V) Norm P-T Amp  Right DorsCutan Anti Sensory (Dorsum 5th MC)  36.6C  Wrist    2.5 <3.2 8.0 >5  Left Median Anti Sensory (2nd Digit)  36.6C  Wrist    3.7 <3.8 12.7 >10  Right Median Anti Sensory (2nd Digit)  36.6C  Wrist    3.1 <3.8 13.9 >10  Left Ulnar Anti Sensory (5th Digit)  36.6C    Wrist    3.0 <3.2 9.2 >5  Right Ulnar Anti Sensory (5th Digit)  36.6C  Wrist    3.0 <3.2 14.2 >5   Motor Summary Table   Stim Site NR Onset (ms) Norm Onset (ms) O-P Amp (mV) Norm O-P Amp Site1 Site2 Delta-0 (ms) Dist (cm) Vel (m/s) Norm Vel (m/s)  Left Median Motor (Abd Poll Brev)  36.6C  Wrist    3.5 <4.0 10.2 >5 Elbow Wrist 4.5 26.0 58 >50  Elbow    8.0  10.0         Right Median Motor (Abd Poll Brev)  36.6C  Wrist    3.4 <4.0 8.7 >5 Elbow Wrist 4.6 26.0 57 >50  Elbow    8.0  8.4         Left Ulnar Motor (Abd Dig Minimi)  36.6C  Wrist    2.7 <3.1 7.2 >7 B Elbow Wrist 4.7 24.0 51 >50  B Elbow    7.4  7.2  A Elbow B Elbow 2.1 10.0 48 >50  A Elbow    9.5  6.8         Right Ulnar Motor (Abd Dig Minimi)  36.6C  Wrist    2.9 <3.1 7.7 >7 B Elbow Wrist 4.1 24.0 59 >50  B Elbow    7.0  7.4  A Elbow B Elbow 2.3 10.0 43 >50  A Elbow    9.3  5.6         Right Ulnar (FDI) Motor (1st DI)  36.6C  Wrist  3.2 <4.5 14.8 >7 B Elbow Wrist 4.7 25.0 53 >50  B Elbow    7.9  14.5  A Elbow B Elbow 2.3 10.0 43 >50  A Elbow    10.2  11.2          Comparison Summary Table   Stim Site NR Peak (ms) Norm Peak (ms) P-T Amp (V) Site1 Site2 Delta-P (ms) Norm Delta (ms)  Left Median/Ulnar Palm Comparison (Wrist - 8cm)  36.6C  Median Palm    2.1 <2.2 41.8 Median Palm Ulnar Palm 0.0   Ulnar Palm    2.1 <2.2 11.2      Right Median/Ulnar Palm Comparison (Wrist - 8cm)  36.6C  Median Palm    2.1 <2.2 17.2 Median Palm Ulnar Palm 0.0   Ulnar Palm    2.1 <2.2 6.8       EMG   Side Muscle Ins Act Fibs Psw Fasc Number Recrt Dur Dur. Amp Amp. Poly Poly. Comment  Right 1stDorInt Nml Nml Nml Nml Nml Nml Nml Nml Nml Nml Nml Nml N/A  Right Ext Indicis Nml Nml Nml Nml Nml Nml Nml Nml Nml Nml Nml Nml N/A  Right PronatorTeres Nml Nml Nml Nml Nml Nml Nml Nml Nml Nml Nml Nml N/A  Right Biceps Nml Nml Nml Nml Nml Nml Nml Nml Nml Nml Nml Nml N/A  Right Triceps Nml Nml Nml Nml Nml Nml Nml Nml Nml Nml Nml Nml N/A   Right Deltoid Nml Nml Nml Nml Nml Nml Nml Nml Nml Nml Nml Nml N/A  Left Deltoid Nml Nml Nml Nml Nml Nml Nml Nml Nml Nml Nml Nml N/A  Left 1stDorInt Nml Nml Nml Nml Nml Nml Nml Nml Nml Nml Nml Nml N/A  Left Ext Indicis Nml Nml Nml Nml Nml Nml Nml Nml Nml Nml Nml Nml N/A  Left PronatorTeres Nml Nml Nml Nml Nml Nml Nml Nml Nml Nml Nml Nml N/A  Left Biceps Nml Nml Nml Nml Nml Nml Nml Nml Nml Nml Nml Nml N/A  Left Triceps Nml Nml Nml Nml Nml Nml Nml Nml Nml Nml Nml Nml N/A      Waveforms:

## 2016-02-27 DIAGNOSIS — M25551 Pain in right hip: Secondary | ICD-10-CM | POA: Diagnosis not present

## 2016-04-03 DIAGNOSIS — M25551 Pain in right hip: Secondary | ICD-10-CM | POA: Diagnosis not present

## 2016-05-19 DIAGNOSIS — M25562 Pain in left knee: Secondary | ICD-10-CM | POA: Diagnosis not present

## 2016-05-29 ENCOUNTER — Ambulatory Visit (INDEPENDENT_AMBULATORY_CARE_PROVIDER_SITE_OTHER): Payer: BLUE CROSS/BLUE SHIELD | Admitting: Interventional Cardiology

## 2016-05-29 ENCOUNTER — Encounter: Payer: Self-pay | Admitting: Interventional Cardiology

## 2016-05-29 VITALS — BP 122/62 | HR 64 | Ht 70.0 in | Wt 230.0 lb

## 2016-05-29 DIAGNOSIS — I2699 Other pulmonary embolism without acute cor pulmonale: Secondary | ICD-10-CM

## 2016-05-29 DIAGNOSIS — Z01810 Encounter for preprocedural cardiovascular examination: Secondary | ICD-10-CM | POA: Diagnosis not present

## 2016-05-29 DIAGNOSIS — I2510 Atherosclerotic heart disease of native coronary artery without angina pectoris: Secondary | ICD-10-CM | POA: Diagnosis not present

## 2016-05-29 DIAGNOSIS — I10 Essential (primary) hypertension: Secondary | ICD-10-CM

## 2016-05-29 DIAGNOSIS — E785 Hyperlipidemia, unspecified: Secondary | ICD-10-CM | POA: Diagnosis not present

## 2016-05-29 NOTE — Patient Instructions (Signed)
Medication Instructions:  None  Labwork: None  Testing/Procedures: Your physician has requested that you have a lexiscan myoview. For further information please visit [email or web address]. Please follow instruction sheet, as given.   Follow-Up: Your physician wants you to follow-up in: 1 year with Dr. Smith.  You will receive a reminder letter in the mail two months in advance. If you don't receive a letter, please call our office to schedule the follow-up appointment.   Any Other Special Instructions Will Be Listed Below (If Applicable).     If you need a refill on your cardiac medications before your next appointment, please call your pharmacy.   

## 2016-05-29 NOTE — Progress Notes (Addendum)
Cardiology Office Note    Date:  05/29/2016   ID:  Roy SarinJeffrey A Smarr, DOB 03/30/1955, MRN [id number]  PCP:  Sanda Lingerhomas Jones, MD  Cardiologist: Lesleigh NoeHenry W Zarianna Dicarlo III, MD   Chief Complaint  Patient presents with  . Coronary Artery Disease    History of Present Illness:  Roy Yang is a 61 y.o. male who presents for h/o recurrent DVT greater than 20 years ago, [facility], hypertension, and hyperlipidemia. Most recent episode of DVT was 2 years ago.   He needs hip replacement surgery by Dr. Mckinley Jewelaniel Murphy in June.  He denies chest discomfort. He has not been able to do much walking or aerobic activity with his legs because of significant hip discomfort. He still sees a trainer 3 times per week and does upper body aerobic and isometric activity without chest discomfort.  Past Medical History:  Diagnosis Date  . CAD (coronary artery disease)   . Clotting disorder (HCC)   . Depression   . DVT, lower extremity, recurrent (HCC)   . Gout   . HSV-2 (herpes simplex virus 2) infection   . Hyperlipidemia   . Hypertension   . Myocardial infarction (HCC)   . PE (pulmonary embolism) 1998  . Thyroid disease     Past Surgical History:  Procedure Laterality Date  . COSMETIC SURGERY    . THYROID SURGERY    . VASECTOMY      Current Medications: Outpatient Medications Prior to Visit  Medication Sig Dispense Refill  . allopurinol (ZYLOPRIM) 300 MG tablet Take 300 mg by mouth daily.    . clomiPHENE (CLOMID) 50 MG tablet Take one half tablet daily 30 tablet 11  . metoprolol succinate (TOPROL-XL) 25 MG 24 hr tablet Take 1 tablet (25 mg total) by mouth daily. 90 tablet 3  . rivaroxaban (XARELTO) 20 MG TABS tablet Take 1 tablet (20 mg total) by mouth daily with supper. 30 tablet 11  . rosuvastatin (CRESTOR) 10 MG tablet Take 1 tablet (10 mg total) by mouth daily. 30 tablet 11  . traMADol (ULTRAM) 50 MG tablet Take 1 tablet (50 mg total) by mouth 3 (three) times daily. 90  tablet 2  . valACYclovir (VALTREX) 1000 MG tablet TAKE 1 TABLET (1,000 MG TOTAL) BY MOUTH 2 (TWO) TIMES DAILY. 60 tablet 11   No facility-administered medications prior to visit.      Allergies:   Patient has no known allergies.   Social History   Social History  . Marital status: Single    Spouse name: N/A  . Number of children: N/A  . Years of education: N/A   Social History Main Topics  . Smoking status: Never Smoker  . Smokeless tobacco: Never Used  . Alcohol use 2.0 - 2.5 oz/week    4 - 5 Standard drinks or equivalent per week  . Drug use: No  . Sexual activity: Yes    Birth control/ protection: Abstinence   Other Topics Concern  . None   Social History Narrative  . None     Family History:  The patient's family history includes Atrial fibrillation in his father and mother; Cancer in his father and mother; Heart disease in his brother and father; Hyperlipidemia in his father and mother; Hypertension in his father; Peripheral vascular disease in his father.   ROS:   Please see the history of present illness.    Joint swelling and upcoming hip replacement.  All other systems reviewed and are  negative.   PHYSICAL EXAM:   VS:  BP 122/62   Pulse 64   Ht 5\' 10"  (1.778 m)   Wt 230 lb (104.3 kg)   BMI 33.00 kg/m    GEN: Well nourished, well developed, in no acute distress  HEENT: normal  Neck: no JVD, carotid bruits, or masses Cardiac: RRR; no murmurs, rubs, or gallops,no edema  Respiratory:  clear to auscultation bilaterally, normal work of breathing GI: soft, nontender, nondistended, + BS MS: no deformity or atrophy  Skin: warm and dry, no rash Neuro:  Alert and Oriented x 3, Strength and sensation are intact Psych: euthymic mood, full affect  Wt Readings from Last 3 Encounters:  05/29/16 230 lb (104.3 kg)  02/06/16 243 lb (110.2 kg)  02/04/16 234 lb (106.1 kg)      Studies/Labs Reviewed:   EKG:  EKG  Normal sinus rhythm with no acute ST-T wave  change. No change compared to prior tracings.  Recent Labs: 06/11/2015: TSH 3.08 11/25/2015: ALT 20; BUN 20; Creat 1.26; Hemoglobin 16.0; Platelets 181; Potassium 3.9; Sodium 140   Lipid Panel    Component Value Date/Time   CHOL 149 11/25/2015 1447   TRIG 119 11/25/2015 1447   HDL 46 11/25/2015 1447   CHOLHDL 3.2 11/25/2015 1447   VLDL 24 11/25/2015 1447   LDLCALC 79 11/25/2015 1447    Additional studies/ records that were reviewed today include:  Review cardiac catheterization from May 2012 which demonstrated successful stent implantation in the ramus intermedius and scattered multivessel coronary plaque. LV function was normal.    ASSESSMENT:    1. Coronary artery disease involving native coronary artery of native heart without angina pectoris   2. Other acute pulmonary embolism without acute cor pulmonale (HCC)   3. Essential hypertension   4. Hyperlipidemia, unspecified hyperlipidemia type   5. Preoperative cardiovascular examination      PLAN:  In order of problems listed above:  1. Stable from a standpoint of angina. No EKG evidence of recurrent myocardial infarction. He is seeking clearance from standpoint of heart as it interacts with hip replacement and recovery/rehabilitation. We'll perform a pharmacologic myocardial perfusion study to assess blood flow since his been greater than 6 years since any evaluation. 2. This is old history without any recent episodes of PE. Last DVT episode was 2016. 3. Blood pressure is well controlled. 4. Continue current medical regimen which includes rosuvastatin. LDL target less than 70.  5. Preoperative cardiovascular clearance will be provided after pharmacologic myocardial perfusion testing to exclude progression of coronary disease in this patient who is six years status post drug-eluting stent to the ramus intermedius.  Medication Adjustments/Labs and Tests Ordered: Current medicines are reviewed at length with the patient today.   Concerns regarding medicines are outlined above.  Medication changes, Labs and Tests ordered today are listed in the Patient Instructions below. Patient Instructions  Medication Instructions:  None  Labwork: None  Testing/Procedures: Your physician has requested that you have a lexiscan myoview. For further information please visit [email or web address] Please follow instruction sheet, as given.    Follow-Up: Your physician wants you to follow-up in: 1 year with Dr. Katrinka Blazing.  You will receive a reminder letter in the mail two months in advance. If you don't receive a letter, please call our office to schedule the follow-up appointment.   Any Other Special Instructions Will Be Listed Below (If Applicable).     If you need a refill on your cardiac medications before your next appointment,  please call your pharmacy.      Signed, Lesleigh Noe, MD  05/29/2016 9:38 AM    [facility] [address] [place], [place], [place]  [address] Phone: [phone]; Fax: [phone]

## 2016-06-19 ENCOUNTER — Other Ambulatory Visit (HOSPITAL_COMMUNITY): Payer: BLUE CROSS/BLUE SHIELD

## 2016-06-19 ENCOUNTER — Ambulatory Visit (INDEPENDENT_AMBULATORY_CARE_PROVIDER_SITE_OTHER): Payer: BLUE CROSS/BLUE SHIELD | Admitting: *Deleted

## 2016-06-19 DIAGNOSIS — I4891 Unspecified atrial fibrillation: Secondary | ICD-10-CM | POA: Diagnosis not present

## 2016-06-19 NOTE — Progress Notes (Signed)
Pt was started on Xarelto for DVT on 11/01/2015 per Dr. Katrinka BlazingSmith. The pt was started on Xarelto 15mg  twice a day for 21 days then Xarelto 20mg  daily.    Reviewed patients medication list.  Pt is not currently on any combined P-gp and strong CYP3A4 inhibitors/inducers (ketoconazole, traconazole, ritonavir, carbamazepine, phenytoin, rifampin, St. John's wort).  Reviewed labs: SCr-1.04, Weight-108.2kg, CrCl-112.8471ml/min.  Dose appropriate based on CrCl & dosing criteria.  Hgb and HCT within normal limits.    A full discussion of the nature of anticoagulants has been carried out.  A benefit/risk analysis has been presented to the patient, so that they understand the justification for choosing anticoagulation with Xarelto at this time.  The need for compliance is stressed.  Pt is aware to take the medication once daily with the largest meal of the day.  Side effects of potential bleeding are discussed, including unusual colored urine or stools, coughing up blood or coffee ground emesis, nose bleeds or serious fall or head trauma.  Discussed signs and symptoms of stroke. The patient should avoid any OTC items containing aspirin or ibuprofen.  Avoid alcohol consumption.   Call if any signs of abnormal bleeding.  Discussed financial obligations and resolved any difficulty in obtaining medication.  Next lab test in 6 months.   06/23/16-labs returned & reviewed. Left a message for the pt to return a call to schedule a 6 month follow-up appt 06/24/16-LMOM for pt to call back to schedule an appt 06/29/16-LMOM for pt to call back to schedule a 6 month f/u appt & that I have been calling since May 29th since labs resulted.  07/01/16-since pt has failed to return a phone call to the [facility] to schedule an appt & multiple messages left for the pt, will route encounter to the Physician to sign & await a call back from pt at his convenience.

## 2016-06-20 LAB — BASIC METABOLIC PANEL
BUN/Creatinine Ratio: 21 (ref 10–24)
BUN: 22 mg/dL (ref 8–27)
CHLORIDE: 104 mmol/L (ref 96–106)
CO2: 21 mmol/L (ref 18–29)
CREATININE: 1.04 mg/dL (ref 0.76–1.27)
Calcium: 9.1 mg/dL (ref 8.6–10.2)
GFR calc Af Amer: 89 mL/min/{1.73_m2} (ref >59)
GFR calc non Af Amer: 77 mL/min/{1.73_m2} (ref >59)
GLUCOSE: 96 mg/dL (ref 65–99)
Potassium: 4.4 mmol/L (ref 3.5–5.2)
SODIUM: 139 mmol/L (ref 134–144)

## 2016-06-20 LAB — CBC WITH DIFFERENTIAL/PLATELET
BASOS: 0 %
Basophils Absolute: 0 10*3/uL (ref 0.0–0.2)
EOS (ABSOLUTE): 0.2 10*3/uL (ref 0.0–0.4)
Eos: 2 %
HEMOGLOBIN: 14.9 g/dL (ref 13.0–17.7)
Hematocrit: 43.9 % (ref 37.5–51.0)
IMMATURE GRANS (ABS): 0 10*3/uL (ref 0.0–0.1)
Immature Granulocytes: 0 %
LYMPHS: 35 %
Lymphocytes Absolute: 2.5 10*3/uL (ref 0.7–3.1)
MCH: 33.6 pg — ABNORMAL HIGH (ref 26.6–33.0)
MCHC: 33.9 g/dL (ref 31.5–35.7)
MCV: 99 fL — ABNORMAL HIGH (ref 79–97)
MONOCYTES: 10 %
Monocytes Absolute: 0.7 10*3/uL (ref 0.1–0.9)
NEUTROS ABS: 3.7 10*3/uL (ref 1.4–7.0)
Neutrophils: 53 %
Platelets: 170 10*3/uL (ref 150–379)
RBC: 4.44 x10E6/uL (ref 4.14–5.80)
RDW: 13.9 % (ref 12.3–15.4)
WBC: 7.1 10*3/uL (ref 3.4–10.8)

## 2016-06-23 ENCOUNTER — Other Ambulatory Visit: Payer: Self-pay | Admitting: Internal Medicine

## 2016-06-23 ENCOUNTER — Telehealth (HOSPITAL_COMMUNITY): Payer: Self-pay | Admitting: *Deleted

## 2016-06-23 ENCOUNTER — Telehealth: Payer: Self-pay | Admitting: *Deleted

## 2016-06-23 DIAGNOSIS — M160 Bilateral primary osteoarthritis of hip: Secondary | ICD-10-CM

## 2016-06-23 DIAGNOSIS — I2699 Other pulmonary embolism without acute cor pulmonale: Secondary | ICD-10-CM

## 2016-06-23 DIAGNOSIS — Z86718 Personal history of other venous thrombosis and embolism: Secondary | ICD-10-CM

## 2016-06-23 DIAGNOSIS — E7800 Pure hypercholesterolemia, unspecified: Secondary | ICD-10-CM

## 2016-06-23 DIAGNOSIS — E291 Testicular hypofunction: Secondary | ICD-10-CM

## 2016-06-23 DIAGNOSIS — I10 Essential (primary) hypertension: Secondary | ICD-10-CM

## 2016-06-23 MED ORDER — CLOMIPHENE CITRATE 50 MG PO TABS: ORAL_TABLET | ORAL | 1 refills | Status: DC

## 2016-06-23 MED ORDER — ROSUVASTATIN CALCIUM 10 MG PO TABS: 10.0000 mg | ORAL_TABLET | Freq: Every day | ORAL | 3 refills | Status: DC

## 2016-06-23 MED ORDER — METOPROLOL SUCCINATE ER 25 MG PO TB24: 25.0000 mg | ORAL_TABLET | Freq: Every day | ORAL | 3 refills | Status: DC

## 2016-06-23 MED ORDER — RIVAROXABAN 20 MG PO TABS: 20.0000 mg | ORAL_TABLET | Freq: Every day | ORAL | 0 refills | Status: DC

## 2016-06-23 NOTE — Telephone Encounter (Signed)
Patient given detailed instructions per Myocardial Perfusion Study Information Sheet for the test on 06/26/16 at 0845. Patient notified to arrive 15 minutes early and that it is imperative to arrive on time for appointment to keep from having the test rescheduled.  If you need to cancel or reschedule your appointment, please call the office within 24 hours of your appointment. . Patient verbalized understanding.Myrta Mercer, Adelene IdlerCynthia W

## 2016-06-23 NOTE — Telephone Encounter (Signed)
MD has sent refills to [facility]...Raechel Chute/lmb

## 2016-06-23 NOTE — Telephone Encounter (Signed)
Left msg on triage requesting refills on his Crestor, Xarelto, Metoprolol, and Clomiphene to be sent to [facility]. Pls advise if ok to refill...Raechel Chute/lmb

## 2016-06-26 ENCOUNTER — Ambulatory Visit (HOSPITAL_COMMUNITY): Payer: BLUE CROSS/BLUE SHIELD | Attending: Cardiovascular Disease

## 2016-06-26 DIAGNOSIS — I2510 Atherosclerotic heart disease of native coronary artery without angina pectoris: Secondary | ICD-10-CM

## 2016-06-26 LAB — MYOCARDIAL PERFUSION IMAGING
CHL CUP NUCLEAR SRS: 5
CHL CUP RESTING HR STRESS: 51 {beats}/min
CSEPPHR: 126 {beats}/min
LV sys vol: 70 mL
LVDIAVOL: 141 mL (ref 62–150)
RATE: 0.3
SDS: 0
SSS: 5
TID: 0.93

## 2016-06-26 MED ORDER — TECHNETIUM TC 99M TETROFOSMIN IV KIT
30.1000 | PACK | Freq: Once | INTRAVENOUS | Status: AC | PRN
  Administered 2016-06-26: 30.1 via INTRAVENOUS
  Filled 2016-06-26: qty 31

## 2016-06-26 MED ORDER — REGADENOSON 0.4 MG/5ML IV SOLN
0.4000 mg | Freq: Once | INTRAVENOUS | Status: AC
  Administered 2016-06-26: 0.4 mg via INTRAVENOUS

## 2016-06-26 MED ORDER — TECHNETIUM TC 99M TETROFOSMIN IV KIT
10.3000 | PACK | Freq: Once | INTRAVENOUS | Status: AC | PRN
  Administered 2016-06-26: 10.3 via INTRAVENOUS
  Filled 2016-06-26: qty 11

## 2016-07-01 ENCOUNTER — Inpatient Hospital Stay: Admit: 2016-07-01 | Payer: BLUE CROSS/BLUE SHIELD | Admitting: Orthopedic Surgery

## 2016-07-01 SURGERY — ARTHROPLASTY, HIP, TOTAL, ANTERIOR APPROACH
Anesthesia: Spinal | Laterality: Right

## 2016-07-08 ENCOUNTER — Other Ambulatory Visit: Payer: Self-pay | Admitting: Emergency Medicine

## 2016-07-08 DIAGNOSIS — E291 Testicular hypofunction: Secondary | ICD-10-CM

## 2016-07-08 DIAGNOSIS — E7800 Pure hypercholesterolemia, unspecified: Secondary | ICD-10-CM

## 2016-07-08 NOTE — Telephone Encounter (Signed)
Pt has established with Sanda Lingerhomas Jones; will forward refill requests to Dr. Yetta BarreJones.

## 2016-09-24 ENCOUNTER — Other Ambulatory Visit: Payer: Self-pay | Admitting: Internal Medicine

## 2016-09-24 ENCOUNTER — Telehealth: Payer: Self-pay | Admitting: Internal Medicine

## 2016-09-24 DIAGNOSIS — M1611 Unilateral primary osteoarthritis, right hip: Secondary | ICD-10-CM | POA: Diagnosis not present

## 2016-09-24 DIAGNOSIS — S83232A Complex tear of medial meniscus, current injury, left knee, initial encounter: Secondary | ICD-10-CM | POA: Diagnosis not present

## 2016-09-24 DIAGNOSIS — I2699 Other pulmonary embolism without acute cor pulmonale: Secondary | ICD-10-CM

## 2016-09-24 DIAGNOSIS — Z86718 Personal history of other venous thrombosis and embolism: Secondary | ICD-10-CM

## 2016-09-24 MED ORDER — RIVAROXABAN 20 MG PO TABS: 20.0000 mg | ORAL_TABLET | Freq: Every day | ORAL | 0 refills | Status: DC

## 2016-09-24 NOTE — Telephone Encounter (Signed)
Pt called for a refill of his rivaroxaban (XARELTO) 20 MG TABS tablet Last seen 01/2016 Please advise  Pharmacy listed on chart

## 2016-09-24 NOTE — Telephone Encounter (Signed)
Pt informed of script sent in

## 2016-09-24 NOTE — Telephone Encounter (Signed)
RX sent

## 2016-09-30 ENCOUNTER — Telehealth: Payer: Self-pay | Admitting: Pharmacist

## 2016-09-30 NOTE — Telephone Encounter (Signed)
[facility]. Pt is undergoing THA on 12/16/16 with request to hold Xarelto prior to surgery. Per protocol, would hold Xarelto for 3 days prior to hip replacement. Pt takes Xarelto for hx of PE and recurrent DVTs (3 total, most recently in 2016). Renal function is normal.  Will route to Dr Katrinka BlazingSmith for input on holding Xarelto for 3 days prior to THA given hx of multiple blood clots as well as for cardiac clearance.

## 2016-10-01 NOTE — Telephone Encounter (Signed)
I agree with holding Xarelto for 3 days prior to surgery then resuming treatment as soon as possible

## 2016-10-01 NOTE — Telephone Encounter (Signed)
Clear[facility] with recommendation to hold Xarelto x3 days prior and resume ASAP after.

## 2016-11-22 ENCOUNTER — Ambulatory Visit: Payer: Self-pay | Admitting: Orthopedic Surgery

## 2016-11-24 ENCOUNTER — Encounter: Payer: Self-pay | Admitting: Adult Health

## 2016-11-24 ENCOUNTER — Ambulatory Visit (INDEPENDENT_AMBULATORY_CARE_PROVIDER_SITE_OTHER): Payer: BLUE CROSS/BLUE SHIELD | Admitting: Adult Health

## 2016-11-24 VITALS — BP 126/80 | Temp 97.9°F | Wt 241.0 lb

## 2016-11-24 DIAGNOSIS — M62838 Other muscle spasm: Secondary | ICD-10-CM

## 2016-11-24 MED ORDER — TIZANIDINE HCL 4 MG PO TABS: 4.0000 mg | ORAL_TABLET | Freq: Every day | ORAL | 0 refills | Status: DC

## 2016-11-24 MED ORDER — METHYLPREDNISOLONE ACETATE 80 MG/ML IJ SUSP
80.0000 mg | Freq: Once | INTRAMUSCULAR | Status: AC
  Administered 2016-11-24: 80 mg via INTRAMUSCULAR

## 2016-11-24 NOTE — Progress Notes (Signed)
Subjective:    Patient ID: Roy Yang, male    DOB: Mar 24, 1955, 61 y.o.   MRN: [id number]  HPI  61 year old male who  has a past medical history of CAD (coronary artery disease); Clotting disorder (HCC); Depression; DVT, lower extremity, recurrent (HCC); Gout; HSV-2 (herpes simplex virus 2) infection; Hyperlipidemia; Hypertension; Myocardial infarction [facility]); PE (pulmonary embolism) (1998); and Thyroid disease.  He presents to the office today for the complaint of right lower/mid back pain that has been present for 7-10 days. The pain is described as " sharp". He reports the pain first started out as a " muscle spasm" but has become worse as the week has gone on.   Walking does not aggravate the pain but changing positions, bending and twisting causes severe spasming.   He denies any trauma and has not noticed any urinary issues - no history of kidney stones   He has been using a tylenol and a heating pad without relief   Review of Systems See HPI   Past Medical History:  Diagnosis Date  . CAD (coronary artery disease)   . Clotting disorder (HCC)   . Depression   . DVT, lower extremity, recurrent (HCC)   . Gout   . HSV-2 (herpes simplex virus 2) infection   . Hyperlipidemia   . Hypertension   . Myocardial infarction (HCC)   . PE (pulmonary embolism) 1998  . Thyroid disease     Social History   Social History  . Marital status: Single    Spouse name: N/A  . Number of children: N/A  . Years of education: N/A   Occupational History  . Not on file.   Social History Main Topics  . Smoking status: Never Smoker  . Smokeless tobacco: Never Used  . Alcohol use 2.0 - 2.5 oz/week    4 - 5 Standard drinks or equivalent per week  . Drug use: No  . Sexual activity: Yes    Birth control/ protection: Abstinence   Other Topics Concern  . Not on file   Social History Narrative  . No narrative on file    Past Surgical History:  Procedure Laterality Date  . COSMETIC  SURGERY    . THYROID SURGERY    . VASECTOMY      Family History  Problem Relation Age of Onset  . Atrial fibrillation Mother   . Cancer Mother   . Hyperlipidemia Mother   . Atrial fibrillation Father   . Peripheral vascular disease Father   . Cancer Father   . Heart disease Father   . Hyperlipidemia Father   . Hypertension Father   . Heart disease Brother     No Known Allergies  Current Outpatient Prescriptions on File Prior to Visit  Medication Sig Dispense Refill  . allopurinol (ZYLOPRIM) 300 MG tablet Take 300 mg by mouth daily.    . clomiPHENE (CLOMID) 50 MG tablet TAKE 1/2 TABLET BY MOUTH DAILY 45 tablet 1  . metoprolol succinate (TOPROL-XL) 25 MG 24 hr tablet Take 1 tablet (25 mg total) by mouth daily. 90 tablet 3  . rivaroxaban (XARELTO) 20 MG TABS tablet Take 1 tablet (20 mg total) by mouth daily with supper. 90 tablet 0  . rosuvastatin (CRESTOR) 10 MG tablet TAKE 1 TABLET BY MOUTH DAILY 90 tablet 1  . valACYclovir (VALTREX) 1000 MG tablet TAKE 1 TABLET (1,000 MG TOTAL) BY MOUTH 2 (TWO) TIMES DAILY. 60 tablet 11  . traMADol (ULTRAM) 50 MG tablet  Take 1 tablet (50 mg total) by mouth 3 (three) times daily. (Patient not taking: Reported on 11/24/2016) 90 tablet 2   No current facility-administered medications on file prior to visit.     BP 126/80   Temp 97.9 F (36.6 C) (Oral)   Wt 241 lb (109.3 kg)   BMI 34.58 kg/m       Objective:   Physical Exam  Constitutional: He is oriented to person, place, and time. He appears well-developed and well-nourished. No distress.  Cardiovascular: Normal rate, regular rhythm, normal heart sounds and intact distal pulses.  Exam reveals no gallop.   No murmur heard. Pulmonary/Chest: Effort normal and breath sounds normal. No respiratory distress. He has no wheezes. He has no rales. He exhibits no tenderness.  Musculoskeletal: He exhibits no tenderness or deformity.  No pain with palpation. Obvious pain in right lower back with  standing or twisting    Neurological: He is alert and oriented to person, place, and time.  Skin: Skin is warm and dry. No rash noted. He is not diaphoretic. No erythema. No pallor.  Psychiatric: He has a normal mood and affect. His behavior is normal. Judgment and thought content normal.  Nursing note and vitals reviewed.     Assessment & Plan:  1. Muscle spasm - exam consistent with muscle spasm. Will give depo medrol shot in the office and prescribe muscle relaxer - tiZANidine (ZANAFLEX) 4 MG tablet; Take 1 tablet (4 mg total) by mouth at bedtime.  Dispense: 15 tablet; Refill: 0 - methylPREDNISolone acetate (DEPO-MEDROL) injection 80 mg; Inject 1 mL (80 mg total) into the muscle once. - Follow up in 2-3 days if no improvement   Shirline Freesory Wirt Hemmerich, NP

## 2016-11-26 ENCOUNTER — Ambulatory Visit (INDEPENDENT_AMBULATORY_CARE_PROVIDER_SITE_OTHER): Payer: BLUE CROSS/BLUE SHIELD | Admitting: Internal Medicine

## 2016-11-26 ENCOUNTER — Encounter: Payer: Self-pay | Admitting: Internal Medicine

## 2016-11-26 VITALS — BP 122/78 | HR 60 | Temp 97.7°F | Resp 16 | Ht 70.0 in | Wt 241.0 lb

## 2016-11-26 DIAGNOSIS — I10 Essential (primary) hypertension: Secondary | ICD-10-CM | POA: Diagnosis not present

## 2016-11-26 NOTE — Patient Instructions (Signed)

## 2016-11-26 NOTE — Progress Notes (Signed)
Subjective:  Patient ID: Roy Yang, male    DOB: 05/05/1955  Age: 61 y.o. MRN: [id number]  CC: Hypertension and Hyperlipidemia   HPI HALE CHALFIN presents for a BP check prior to undergoing THR. He has been seen by cardiology. He feels well and offers no complaints.  Outpatient Medications Prior to Visit  Medication Sig Dispense Refill  . allopurinol (ZYLOPRIM) 300 MG tablet Take 300 mg by mouth daily.    . clomiPHENE (CLOMID) 50 MG tablet TAKE 1/2 TABLET BY MOUTH DAILY 45 tablet 1  . metoprolol succinate (TOPROL-XL) 25 MG 24 hr tablet Take 1 tablet (25 mg total) by mouth daily. 90 tablet 3  . rivaroxaban (XARELTO) 20 MG TABS tablet Take 1 tablet (20 mg total) by mouth daily with supper. 90 tablet 0  . rosuvastatin (CRESTOR) 10 MG tablet TAKE 1 TABLET BY MOUTH DAILY 90 tablet 1  . tiZANidine (ZANAFLEX) 4 MG tablet Take 1 tablet (4 mg total) by mouth at bedtime. 15 tablet 0  . valACYclovir (VALTREX) 1000 MG tablet TAKE 1 TABLET (1,000 MG TOTAL) BY MOUTH 2 (TWO) TIMES DAILY. 60 tablet 11  . traMADol (ULTRAM) 50 MG tablet Take 1 tablet (50 mg total) by mouth 3 (three) times daily. (Patient not taking: Reported on 11/24/2016) 90 tablet 2   No facility-administered medications prior to visit.     ROS Review of Systems  Constitutional: Negative.  Negative for diaphoresis and fatigue.  HENT: Negative.   Eyes: Negative for visual disturbance.  Respiratory: Negative.  Negative for cough, chest tightness, shortness of breath and wheezing.   Cardiovascular: Negative for chest pain, palpitations and leg swelling.  Gastrointestinal: Negative.  Negative for abdominal pain, constipation, diarrhea, nausea and vomiting.  Endocrine: Negative.   Genitourinary: Negative.  Negative for difficulty urinating.  Musculoskeletal: Positive for arthralgias. Negative for myalgias and neck pain.  Skin: Negative.   Allergic/Immunologic: Negative.   Neurological: Negative.  Negative for dizziness.    Hematological: Negative for adenopathy. Does not bruise/bleed easily.  Psychiatric/Behavioral: Negative.     Objective:  BP 122/78 (BP Location: Left Arm, Patient Position: Sitting, Cuff Size: Large)   Pulse 60   Temp 97.7 F (36.5 C) (Oral)   Resp 16   Ht 5\' 10"  (1.778 m)   Wt 241 lb (109.3 kg)   SpO2 97%   BMI 34.58 kg/m   BP Readings from Last 3 Encounters:  11/26/16 122/78  11/24/16 126/80  05/29/16 122/62    Wt Readings from Last 3 Encounters:  11/26/16 241 lb (109.3 kg)  11/24/16 241 lb (109.3 kg)  05/29/16 230 lb (104.3 kg)    Physical Exam  Constitutional: He is oriented to person, place, and time. No distress.  HENT:  Mouth/Throat: Oropharynx is clear and moist. No oropharyngeal exudate.  Eyes: Conjunctivae are normal. Right eye exhibits no discharge. Left eye exhibits no discharge. No scleral icterus.  Neck: Normal range of motion. Neck supple. No JVD present. No thyromegaly present.  Cardiovascular: Normal rate, regular rhythm and intact distal pulses.  Exam reveals no gallop and no friction rub.   No murmur heard. Pulmonary/Chest: Effort normal and breath sounds normal. No respiratory distress. He has no wheezes. He has no rales. He exhibits no tenderness.  Abdominal: Soft. Bowel sounds are normal. He exhibits no distension and no mass. There is no tenderness. There is no rebound and no guarding.  Musculoskeletal: Normal range of motion. He exhibits no edema, tenderness or deformity.  Lymphadenopathy:  He has no cervical adenopathy.  Neurological: He is alert and oriented to person, place, and time.  Skin: Skin is warm and dry. No rash noted. He is not diaphoretic. No erythema. No pallor.  Vitals reviewed.   Lab Results  Component Value Date   WBC 7.1 06/19/2016   HGB 14.9 06/19/2016   HCT 43.9 06/19/2016   PLT 170 06/19/2016   GLUCOSE 96 06/19/2016   CHOL 149 11/25/2015   TRIG 119 11/25/2015   HDL 46 11/25/2015   LDLCALC 79 11/25/2015   ALT 20  11/25/2015   AST 21 11/25/2015   NA 139 06/19/2016   K 4.4 06/19/2016   CL 104 06/19/2016   CREATININE 1.04 06/19/2016   BUN 22 06/19/2016   CO2 21 06/19/2016   TSH 3.08 06/11/2015   PSA 1.36 06/11/2015   INR 2.3 11/01/2015   HGBA1C 5.4 06/11/2015    No results found.  Assessment & Plan:   Tinnie GensJeffrey was seen today for hypertension and hyperlipidemia.  Diagnoses and all orders for this visit:  Essential hypertension- his BP is well controlled, he is low risk for ortho surgery, I recommend that he proceed. The anticoagulation issue has been addressed with him.   I have discontinued Mr. Sheral ApleyDenis's traMADol. I am also having him maintain his allopurinol, valACYclovir, metoprolol succinate, clomiPHENE, rosuvastatin, rivaroxaban, and tiZANidine.  No orders of the defined types were placed in this encounter.    Follow-up: Return if symptoms worsen or fail to improve.  Sanda Lingerhomas Reuben Knoblock, MD

## 2016-12-02 ENCOUNTER — Ambulatory Visit: Payer: Self-pay | Admitting: Orthopedic Surgery

## 2016-12-02 NOTE — H&P (View-Only) (Signed)
Roy Yang DOB: 04/20/1955 Divorced / Language: English / Race: White Male Date of admission: December 16, 2016  Chief complaint: right hip pain and left knee pain History of Present Illness The patient is a 61 year old male who comes in for a preoperative History and Physical. The patient is scheduled for a right total hip arthroplasty (anterior) and a left knee cortiosne injection to be performed by Dr. Gus Rankin. [name], MD at [facility] on 12-16-2016. The patient was seen for a second opinion.The patient reports right hip (groin) problems including pain symptoms that have been present for many month(s). The symptoms began without any known injury (he has had left knee pain for several years, so he overcompensates which he states could have brought on his hip pain). The patient describes the hip problem as sharp (shooting pain if he turns his leg a certain way).The symptoms are described as moderate in severity (can be severe, especially when he first puts weight on his leg after driving for an extended period).The patient feels as if their symptoms are does feel they are worsening. Symptoms are exacerbated by flexing hip and walking (for extended periods). Previous workup for this problem has included pelvic x-rays and hip x-rays (dated 04/03/16). Previous treatment for this problem has included corticosteroid injection (02/27/16, relieved pain by about 70%, only lasted for a few weeks), non-opioid analgesics (Tylenol, did not help) and opioid analgesics (Tramadol, did not help). He cannot take NSAIDs due to being on Xarelto. He also reports several years of left knee pain. He has had about 4 cortisone injections. He had an MRI. His doctor wanted to schedule a knee scope, but then he started having right hip problems. He is having pain in both the RIGHT hip and LEFT knee but currently the hip is causing him many more functional problems in the knee is. He has advanced arthritis in that hip is  at a stage where it is bothering him at all times. LEFT knee is also problematic but is not causing as much of a functional issue. He is now ready to get the hip replaced and would like to get the knee injected at the same time. They have been treated conservatively in the past for the above stated problem and despite conservative measures, they continue to have progressive pain and severe functional limitations and dysfunction. They have failed non-operative management including home exercise, medications, and injections. It is felt that they would benefit from undergoing total joint replacement. Risks and benefits of the procedure have been discussed with the patient and they elect to proceed with surgery. There are no active contraindications to surgery such as ongoing infection or rapidly progressive neurological disease.  Problem List/Past Medical Complex tear of medial meniscus of left knee as current injury, initial encounter (Z61.096E)  Primary localized osteoarthritis of right hip (M16.11)  Blood Clot  Gout  High blood pressure  Hypercholesterolemia  Myocardial infarction  2012 Vertigo  Deep vein thrombosis  Three Times Pulmonary Embolism  Once  Allergies No Known Drug Allergies  Family History  Cancer  Father, Mother. mother Hypertension  Father, Mother. father Rheumatoid Arthritis  father Father  Deceased. age 59 Mother  Deceased. age 65  Social History  Children  3 Alcohol use  current drinker; drinks wine; 5-7 per week Current drinker  08/24/2016: Currently drinks wine and hard liquor 5-7 times per week Current work status  working full time Drug/[facility]cohol [facility] (Currently)  no Exercise  Exercises weekly; does  other and gym / weights Exercises daily; does running / walking and gym / weights Living situation  live with spouse live alone Drug/Alcohol Rehab (Previously)  no Marital status  married divorced No history of drug/alcohol rehab   Illicit drug use  no Not under pain contract  Number of flights of stairs before winded  2-3 4-5 Tobacco / smoke exposure  08/24/2016: no no Tobacco use  Never smoker. 08/24/2016 never smoker Pain Contract  no Post-Surgical Plans  Home With Family. Advance Directives  Living Will.  Medication History Xarelto (20MG  Tablet, Oral) Active. Rosuvastatin Calcium (10MG  Tablet, Oral) Active. Metoprolol Succinate ER (Oral) Specific strength unknown - Active. Allopurinol (Oral) Specific strength unknown - Active. ClomiPHENE Citrate (50MG  Tablet, Oral) Active.  Past Surgical History  Heart Stents  Date: 2012. One Thyroidectomy; Subtotal  left Vasectomy   Review of Systems General Not Present- Chills, Fatigue, Fever, Memory Loss, Night Sweats, Weight Gain and Weight Loss. Skin Not Present- Eczema, Hives, Itching, Lesions and Rash. HEENT Not Present- Dentures, Double Vision, Headache, Hearing Loss, Tinnitus and Visual Loss. Respiratory Not Present- Allergies, Chronic Cough, Coughing up blood, Shortness of breath at rest and Shortness of breath with exertion. Cardiovascular Not Present- Chest Pain, Difficulty Breathing Lying Down, Murmur, Palpitations, Racing/skipping heartbeats and Swelling. Gastrointestinal Not Present- Abdominal Pain, Bloody Stool, Constipation, Diarrhea, Difficulty Swallowing, Heartburn, Jaundice, Loss of appetitie, Nausea and Vomiting. Male Genitourinary Not Present- Blood in Urine, Discharge, Flank Pain, Incontinence, Painful Urination, Urgency, Urinary frequency, Urinary Retention, Urinating at Night and Weak urinary stream. Musculoskeletal Present- Back Pain, Joint Pain and Spasms. Not Present- Joint Swelling, Morning Stiffness, Muscle Pain and Muscle Weakness. Neurological Not Present- Blackout spells, Difficulty with balance, Dizziness, Paralysis, Tremor and Weakness. Psychiatric Not Present- Insomnia. Hematology Present- Blood  Clots.  Vitals Weight: 238 lb Height: 70in Weight was reported by patient. Height was reported by patient. Body Surface Area: 2.25 m Body Mass Index: 34.15 kg/m  Pulse: 64 (Regular)  BP: 118/58 (Sitting, Right Arm, Standard)  Physical Exam  General Mental Status -Alert, cooperative and good historian. General Appearance-pleasant, Not in acute distress. Orientation-Oriented X3. Build & Nutrition-Well nourished and Well developed.  Head and Neck Head-normocephalic, atraumatic . Neck Global Assessment - supple, no bruit auscultated on the right, no bruit auscultated on the left.  Eye Vision-Wears corrective lenses(readers). Pupil - Bilateral-Regular and Round. Motion - Bilateral-EOMI.  Chest and Lung Exam Auscultation Breath sounds - clear at anterior chest wall and clear at posterior chest wall. Adventitious sounds - No Adventitious sounds.  Cardiovascular Auscultation Rhythm - Regular rate and rhythm. Heart Sounds - S1 WNL and S2 WNL. Murmurs & Other Heart Sounds - Auscultation of the heart reveals - No Murmurs.  Abdomen Palpation/Percussion Tenderness - Abdomen is non-tender to palpation. Rigidity (guarding) - Abdomen is soft. Auscultation Auscultation of the abdomen reveals - Bowel sounds normal.  Male Genitourinary Note: Not done, not pertinent to present illness  Musculoskeletal Note: Evaluation of the left hip shows flexion to 120 rotation in 30 out 40 and abduction 40 without discomfort. There is no tenderness over the greater trochanter. There is no pain on provocative testing of the hip. His RIGHT hip can be flexed to 90 with no internal rotation, 5 of external rotation and about 20 of abduction. He has pain on hip motion. His RIGHT knee exam is normal. His LEFT knee shows no effusion. His range of motion is 0-125. He is tender lateral greater than medial with no instability.  Reviewed radiographs of  his pelvis and RIGHT hip  showing bone-on-bone arthritis in the RIGHT hip with subchondral cystic change. His LEFT hip looks normal. His LEFT knee plain radiographs show no arthritis. His MRI scan shows what appears to be a posterior horn medial meniscus tear.  Assessment & Plan Primary localized osteoarthritis of right hip (M16.11) Complex tear of medial meniscus of left knee as current injury, initial encounter (G18.299B(S83.232A)  Note:Surgical Plans: Right Total Hip Replacement - Anterior Approach Left Knee cortisone injection  Disposition: Home, HHPT  PCP: Dr. Sanda Lingerhomas Jones - Patient has been seen preoperatively and felt to be stable for surgery. Cards: Dr. Katrinka BlazingSmith - Patient has been seen preoperatively and felt to be stable for surgery. "Hold Xarelto x 3 days prior and resume ASAP..."  Topical TXA  Anesthesia Issues: None  Patient was instructed on what medications to stop prior to surgery.  Signed electronically by Lauraine RinneAlexzandrew L Aadvik Roker, III PA-C

## 2016-12-02 NOTE — H&P (Signed)
Roy Yang DOB: 04/20/1955 Divorced / Language: English / Race: White Male Date of admission: December 16, 2016  Chief complaint: right hip pain and left knee pain History of Present Illness The patient is a 61 year old male who comes in for a preoperative History and Physical. The patient is scheduled for a right total hip arthroplasty (anterior) and a left knee cortiosne injection to be performed by Dr. Gus Rankin. [name], MD at [facility] on 12-16-2016. The patient was seen for a second opinion.The patient reports right hip (groin) problems including pain symptoms that have been present for many month(s). The symptoms began without any known injury (he has had left knee pain for several years, so he overcompensates which he states could have brought on his hip pain). The patient describes the hip problem as sharp (shooting pain if he turns his leg a certain way).The symptoms are described as moderate in severity (can be severe, especially when he first puts weight on his leg after driving for an extended period).The patient feels as if their symptoms are does feel they are worsening. Symptoms are exacerbated by flexing hip and walking (for extended periods). Previous workup for this problem has included pelvic x-rays and hip x-rays (dated 04/03/16). Previous treatment for this problem has included corticosteroid injection (02/27/16, relieved pain by about 70%, only lasted for a few weeks), non-opioid analgesics (Tylenol, did not help) and opioid analgesics (Tramadol, did not help). He cannot take NSAIDs due to being on Xarelto. He also reports several years of left knee pain. He has had about 4 cortisone injections. He had an MRI. His doctor wanted to schedule a knee scope, but then he started having right hip problems. He is having pain in both the RIGHT hip and LEFT knee but currently the hip is causing him many more functional problems in the knee is. He has advanced arthritis in that hip is  at a stage where it is bothering him at all times. LEFT knee is also problematic but is not causing as much of a functional issue. He is now ready to get the hip replaced and would like to get the knee injected at the same time. They have been treated conservatively in the past for the above stated problem and despite conservative measures, they continue to have progressive pain and severe functional limitations and dysfunction. They have failed non-operative management including home exercise, medications, and injections. It is felt that they would benefit from undergoing total joint replacement. Risks and benefits of the procedure have been discussed with the patient and they elect to proceed with surgery. There are no active contraindications to surgery such as ongoing infection or rapidly progressive neurological disease.  Problem List/Past Medical Complex tear of medial meniscus of left knee as current injury, initial encounter (Z61.096E)  Primary localized osteoarthritis of right hip (M16.11)  Blood Clot  Gout  High blood pressure  Hypercholesterolemia  Myocardial infarction  2012 Vertigo  Deep vein thrombosis  Three Times Pulmonary Embolism  Once  Allergies No Known Drug Allergies  Family History  Cancer  Father, Mother. mother Hypertension  Father, Mother. father Rheumatoid Arthritis  father Father  Deceased. age 59 Mother  Deceased. age 65  Social History  Children  3 Alcohol use  current drinker; drinks wine; 5-7 per week Current drinker  08/24/2016: Currently drinks wine and hard liquor 5-7 times per week Current work status  working full time [facility] (Currently)  no Exercise  Exercises weekly; does  other and gym / weights Exercises daily; does running / walking and gym / weights Living situation  live with spouse live alone Drug/Alcohol Rehab (Previously)  no Marital status  married divorced No history of drug/alcohol rehab   Illicit drug use  no Not under pain contract  Number of flights of stairs before winded  2-3 4-5 Tobacco / smoke exposure  08/24/2016: no no Tobacco use  Never smoker. 08/24/2016 never smoker Pain Contract  no Post-Surgical Plans  Home With Family. Advance Directives  Living Will.  Medication History Xarelto (20MG  Tablet, Oral) Active. Rosuvastatin Calcium (10MG  Tablet, Oral) Active. Metoprolol Succinate ER (Oral) Specific strength unknown - Active. Allopurinol (Oral) Specific strength unknown - Active. ClomiPHENE Citrate (50MG  Tablet, Oral) Active.  Past Surgical History  Heart Stents  Date: 2012. One Thyroidectomy; Subtotal  left Vasectomy   Review of Systems General Not Present- Chills, Fatigue, Fever, Memory Loss, Night Sweats, Weight Gain and Weight Loss. Skin Not Present- Eczema, Hives, Itching, Lesions and Rash. HEENT Not Present- Dentures, Double Vision, Headache, Hearing Loss, Tinnitus and Visual Loss. Respiratory Not Present- Allergies, Chronic Cough, Coughing up blood, Shortness of breath at rest and Shortness of breath with exertion. Cardiovascular Not Present- Chest Pain, Difficulty Breathing Lying Down, Murmur, Palpitations, Racing/skipping heartbeats and Swelling. Gastrointestinal Not Present- Abdominal Pain, Bloody Stool, Constipation, Diarrhea, Difficulty Swallowing, Heartburn, Jaundice, Loss of appetitie, Nausea and Vomiting. Male Genitourinary Not Present- Blood in Urine, Discharge, Flank Pain, Incontinence, Painful Urination, Urgency, Urinary frequency, Urinary Retention, Urinating at Night and Weak urinary stream. Musculoskeletal Present- Back Pain, Joint Pain and Spasms. Not Present- Joint Swelling, Morning Stiffness, Muscle Pain and Muscle Weakness. Neurological Not Present- Blackout spells, Difficulty with balance, Dizziness, Paralysis, Tremor and Weakness. Psychiatric Not Present- Insomnia. Hematology Present- Blood  Clots.  Vitals Weight: 238 lb Height: 70in Weight was reported by patient. Height was reported by patient. Body Surface Area: 2.25 m Body Mass Index: 34.15 kg/m  Pulse: 64 (Regular)  BP: 118/58 (Sitting, Right Arm, Standard)  Physical Exam  General Mental Status -Alert, cooperative and good historian. General Appearance-pleasant, Not in acute distress. Orientation-Oriented X3. Build & Nutrition-Well nourished and Well developed.  Head and Neck Head-normocephalic, atraumatic . Neck Global Assessment - supple, no bruit auscultated on the right, no bruit auscultated on the left.  Eye Vision-Wears corrective lenses(readers). Pupil - Bilateral-Regular and Round. Motion - Bilateral-EOMI.  Chest and Lung Exam Auscultation Breath sounds - clear at anterior chest wall and clear at posterior chest wall. Adventitious sounds - No Adventitious sounds.  Cardiovascular Auscultation Rhythm - Regular rate and rhythm. Heart Sounds - S1 WNL and S2 WNL. Murmurs & Other Heart Sounds - Auscultation of the heart reveals - No Murmurs.  Abdomen Palpation/Percussion Tenderness - Abdomen is non-tender to palpation. Rigidity (guarding) - Abdomen is soft. Auscultation Auscultation of the abdomen reveals - Bowel sounds normal.  Male Genitourinary Note: Not done, not pertinent to present illness  Musculoskeletal Note: Evaluation of the left hip shows flexion to 120 rotation in 30 out 40 and abduction 40 without discomfort. There is no tenderness over the greater trochanter. There is no pain on provocative testing of the hip. His RIGHT hip can be flexed to 90 with no internal rotation, 5 of external rotation and about 20 of abduction. He has pain on hip motion. His RIGHT knee exam is normal. His LEFT knee shows no effusion. His range of motion is 0-125. He is tender lateral greater than medial with no instability.  Reviewed radiographs of  his pelvis and RIGHT hip  showing bone-on-bone arthritis in the RIGHT hip with subchondral cystic change. His LEFT hip looks normal. His LEFT knee plain radiographs show no arthritis. His MRI scan shows what appears to be a posterior horn medial meniscus tear.  Assessment & Plan Primary localized osteoarthritis of right hip (M16.11) Complex tear of medial meniscus of left knee as current injury, initial encounter (G18.299B(S83.232A)  Note:Surgical Plans: Right Total Hip Replacement - Anterior Approach Left Knee cortisone injection  Disposition: Home, HHPT  PCP: Dr. Sanda Lingerhomas Jones - Patient has been seen preoperatively and felt to be stable for surgery. Cards: Dr. Katrinka BlazingSmith - Patient has been seen preoperatively and felt to be stable for surgery. "Hold Xarelto x 3 days prior and resume ASAP..."  Topical TXA  Anesthesia Issues: None  Patient was instructed on what medications to stop prior to surgery.  Signed electronically by Lauraine RinneAlexzandrew L Perkins, III PA-C

## 2016-12-09 NOTE — Progress Notes (Signed)
11-26-16  (EPIC) Clearance from Dr. Yetta BarreJones in LOV

## 2016-12-09 NOTE — Progress Notes (Signed)
09-25-16 Surgical Clearance from Dr. Maisie Fushomas on chart.  06-26-16 (EPIC) Stress Test  05-29-16 (EPIC) EKG and LOV w/Dr. Katrinka BlazingSmith, Cardiologist w/plan to follow up in 722yr.

## 2016-12-09 NOTE — Patient Instructions (Addendum)
Roy SarinJeffrey A Yang  12/09/2016   Your procedure is scheduled on: 12-16-16   [facility]  Report to Admitting at 9:00 AM   Call this number if you have problems the morning of surgery  [phone]   Remember: ONLY 1 PERSON MAY GO WITH YOU TO SHORT STAY TO GET  READY MORNING OF YOUR SURGERY.  Do not eat food or drink liquids :After Midnight.     Take these medicines the morning of surgery with A SIP OF WATER: Metoprolol Succinate (Toprol-XL), Rosuvastatin (Crestor)                                You may not have any metal on your body including hair pins and              piercings  Do not wear jewelry, make-up, lotions, powders or perfumes, deodorant             Men may shave face and neck.   Do not bring valuables to the hospital.  [facility] IS NOT             RESPONSIBLE   FOR VALUABLES.  Contacts, dentures or bridgework may not be worn into surgery.  Leave suitcase in the car. After surgery it may be brought to your room.                  Please read over the following fact sheets you were given: _____________________________________________________________________             [facility] - Pre[facility] Before surgery, you can play an important role.  Because skin is not sterile, your skin needs to be as free of germs as possible.  You can reduce the number of germs on your skin by washing with CHG (chlorahexidine gluconate) soap before surgery.  CHG is an antiseptic cleaner which kills germs and bonds with the skin to continue killing germs even after washing. Please DO NOT use if you have an allergy to CHG or antibacterial soaps.  If your skin becomes reddened/irritated stop using the CHG and inform your nurse when you arrive at Short Stay. Do not shave (including legs and underarms) for at least 48 hours prior to the first CHG shower.  You may shave your face/neck. Please follow these instructions carefully:  1.   Shower with CHG Soap the night before surgery and the  morning of Surgery.  2.  If you choose to wash your hair, wash your hair first as usual with your  normal  shampoo.  3.  After you shampoo, rinse your hair and body thoroughly to remove the  shampoo.                           4.  Use CHG as you would any other liquid soap.  You can apply chg directly  to the skin and wash                       Gently with a scrungie or clean washcloth.  5.  Apply the CHG Soap to your body ONLY FROM THE NECK DOWN.   Do not use on face/ open  Wound or open sores. Avoid contact with eyes, ears mouth and genitals (private parts).                       Wash face,  Genitals (private parts) with your normal soap.             6.  Wash thoroughly, paying special attention to the area where your surgery  will be performed.  7.  Thoroughly rinse your body with warm water from the neck down.  8.  DO NOT shower/wash with your normal soap after using and rinsing off  the CHG Soap.                9.  Pat yourself dry with a clean towel.            10.  Wear clean pajamas.            11.  Place clean sheets on your bed the night of your first shower and do not  sleep with pets. Day of Surgery : Do not apply any lotions/deodorants the morning of surgery.  Please wear clean clothes to the hospital/surgery center.  FAILURE TO FOLLOW THESE INSTRUCTIONS MAY RESULT IN THE CANCELLATION OF YOUR SURGERY PATIENT SIGNATURE_________________________________  NURSE SIGNATURE__________________________________  ________________________________________________________________________   Roy Yang  An incentive spirometer is a tool that can help keep your lungs clear and active. This tool measures how well you are filling your lungs with each breath. Taking long deep breaths may help reverse or decrease the chance of developing breathing (pulmonary) problems (especially infection) following:  A long  period of time when you are unable to move or be active. BEFORE THE PROCEDURE   If the spirometer includes an indicator to show your best effort, your nurse or respiratory therapist will set it to a desired goal.  If possible, sit up straight or lean slightly forward. Try not to slouch.  Hold the incentive spirometer in an upright position. INSTRUCTIONS FOR USE  1. Sit on the edge of your bed if possible, or sit up as far as you can in bed or on a chair. 2. Hold the incentive spirometer in an upright position. 3. Breathe out normally. 4. Place the mouthpiece in your mouth and seal your lips tightly around it. 5. Breathe in slowly and as deeply as possible, raising the piston or the ball toward the top of the column. 6. Hold your breath for 3-5 seconds or for as long as possible. Allow the piston or ball to fall to the bottom of the column. 7. Remove the mouthpiece from your mouth and breathe out normally. 8. Rest for a few seconds and repeat Steps 1 through 7 at least 10 times every 1-2 hours when you are awake. Take your time and take a few normal breaths between deep breaths. 9. The spirometer may include an indicator to show your best effort. Use the indicator as a goal to work toward during each repetition. 10. After each set of 10 deep breaths, practice coughing to be sure your lungs are clear. If you have an incision (the cut made at the time of surgery), support your incision when coughing by placing a pillow or rolled up towels firmly against it. Once you are able to get out of bed, walk around indoors and cough well. You may stop using the incentive spirometer when instructed by your caregiver.  RISKS AND COMPLICATIONS  Take your time so you do not get  dizzy or light-headed.  If you are in pain, you may need to take or ask for pain medication before doing incentive spirometry. It is harder to take a deep breath if you are having pain. AFTER USE  Rest and breathe slowly and  easily.  It can be helpful to keep track of a log of your progress. Your caregiver can provide you with a simple table to help with this. If you are using the spirometer at home, follow these instructions: [place] IF:   You are having difficultly using the spirometer.  You have trouble using the spirometer as often as instructed.  Your pain medication is not giving enough relief while using the spirometer.  You develop fever of 100.5 F (38.1 C) or higher. SEEK IMMEDIATE MEDICAL CARE IF:   You cough up bloody sputum that had not been present before.  You develop fever of 102 F (38.9 C) or greater.  You develop worsening pain at or near the incision site. MAKE SURE YOU:   Understand these instructions.  Will watch your condition.  Will get help right away if you are not doing well or get worse. Document Released: 05/25/2006 Document Revised: 04/06/2011 Document Reviewed: 07/26/2006 Exit[employer] Patient Information 2014 [employer], [place].   ________________________________________________________________________  WHAT IS A BLOOD TRANSFUSION? Blood Transfusion Information  A transfusion is the replacement of blood or some of its parts. Blood is made up of multiple cells which provide different functions.  Red blood cells carry oxygen and are used for blood loss replacement.  White blood cells fight against infection.  Platelets control bleeding.  Plasma helps clot blood.  Other blood products are available for specialized needs, such as hemophilia or other clotting disorders. BEFORE THE TRANSFUSION  Who gives blood for transfusions?   Healthy volunteers who are fully evaluated to make sure their blood is safe. This is blood bank blood. Transfusion therapy is the safest it has ever been in the practice of medicine. Before blood is taken from a donor, a complete history is taken to make sure that person has no history of diseases nor engages in risky social  behavior (examples are intravenous drug use or sexual activity with multiple partners). The donor's travel history is screened to minimize risk of transmitting infections, such as malaria. The donated blood is tested for signs of infectious diseases, such as HIV and hepatitis. The blood is then tested to be sure it is compatible with you in order to minimize the chance of a transfusion reaction. If you or a relative donates blood, this is often done in anticipation of surgery and is not appropriate for emergency situations. It takes many days to process the donated blood. RISKS AND COMPLICATIONS Although transfusion therapy is very safe and saves many lives, the main dangers of transfusion include:   Getting an infectious disease.  Developing a transfusion reaction. This is an allergic reaction to something in the blood you were given. Every precaution is taken to prevent this. The decision to have a blood transfusion has been considered carefully by your caregiver before blood is given. Blood is not given unless the benefits outweigh the risks. AFTER THE TRANSFUSION  Right after receiving a blood transfusion, you will usually feel much better and more energetic. This is especially true if your red blood cells have gotten low (anemic). The transfusion raises the level of the red blood cells which carry oxygen, and this usually causes an energy increase.  The nurse administering the transfusion will  monitor you carefully for complications. HOME CARE INSTRUCTIONS  No special instructions are needed after a transfusion. You may find your energy is better. Speak with your caregiver about any limitations on activity for underlying diseases you may have. SEEK MEDICAL CARE IF:   Your condition is not improving after your transfusion.  You develop redness or irritation at the intravenous (IV) site. SEEK IMMEDIATE MEDICAL CARE IF:  Any of the following symptoms occur over the next 12 hours:  Shaking  chills.  You have a temperature by mouth above 102 F (38.9 C), not controlled by medicine.  Chest, back, or muscle pain.  People around you feel you are not acting correctly or are confused.  Shortness of breath or difficulty breathing.  Dizziness and fainting.  You get a rash or develop hives.  You have a decrease in urine output.  Your urine turns a dark color or changes to pink, red, or brown. Any of the following symptoms occur over the next 10 days:  You have a temperature by mouth above 102 F (38.9 C), not controlled by medicine.  Shortness of breath.  Weakness after normal activity.  The white part of the eye turns yellow (jaundice).  You have a decrease in the amount of urine or are urinating less often.  Your urine turns a dark color or changes to pink, red, or brown. Document Released: 01/10/2000 Document Revised: 04/06/2011 Document Reviewed: 08/29/2007 [facility] Patient Information 2014 [place], [place].  _______________________________________________________________________

## 2016-12-10 ENCOUNTER — Encounter (HOSPITAL_COMMUNITY)
Admission: RE | Admit: 2016-12-10 | Discharge: 2016-12-10 | Disposition: A | Discharge status: Home / Self Care | Payer: BLUE CROSS/BLUE SHIELD | Source: Ambulatory Visit | Attending: Orthopedic Surgery | Admitting: Orthopedic Surgery

## 2016-12-10 ENCOUNTER — Other Ambulatory Visit: Payer: Self-pay

## 2016-12-10 ENCOUNTER — Encounter (HOSPITAL_COMMUNITY): Payer: Self-pay

## 2016-12-10 DIAGNOSIS — M1611 Unilateral primary osteoarthritis, right hip: Secondary | ICD-10-CM | POA: Diagnosis not present

## 2016-12-10 DIAGNOSIS — M1712 Unilateral primary osteoarthritis, left knee: Secondary | ICD-10-CM | POA: Diagnosis not present

## 2016-12-10 DIAGNOSIS — Z0183 Encounter for blood typing: Secondary | ICD-10-CM | POA: Insufficient documentation

## 2016-12-10 DIAGNOSIS — Z01812 Encounter for preprocedural laboratory examination: Secondary | ICD-10-CM | POA: Insufficient documentation

## 2016-12-10 LAB — SURGICAL PCR SCREEN
MRSA, PCR: NEGATIVE
Staphylococcus aureus: POSITIVE — AB

## 2016-12-10 LAB — CBC
HCT: 45.9 % (ref 39.0–52.0)
Hemoglobin: 15.5 g/dL (ref 13.0–17.0)
MCH: 33.1 pg (ref 26.0–34.0)
MCHC: 33.8 g/dL (ref 30.0–36.0)
MCV: 98.1 fL (ref 78.0–100.0)
PLATELETS: 165 10*3/uL (ref 150–400)
RBC: 4.68 MIL/uL (ref 4.22–5.81)
RDW: 13 % (ref 11.5–15.5)
WBC: 7.3 10*3/uL (ref 4.0–10.5)

## 2016-12-10 LAB — PROTIME-INR
INR: 1.62
Prothrombin Time: 19.1 seconds — ABNORMAL HIGH (ref 11.4–15.2)

## 2016-12-10 LAB — COMPREHENSIVE METABOLIC PANEL
ALBUMIN: 3.9 g/dL (ref 3.5–5.0)
ALT: 22 U/L (ref 17–63)
AST: 23 U/L (ref 15–41)
Alkaline Phosphatase: 55 U/L (ref 38–126)
Anion gap: 5 (ref 5–15)
BUN: 24 mg/dL — AB (ref 6–20)
CHLORIDE: 108 mmol/L (ref 101–111)
CO2: 25 mmol/L (ref 22–32)
CREATININE: 1.18 mg/dL (ref 0.61–1.24)
Calcium: 9.2 mg/dL (ref 8.9–10.3)
GFR calc Af Amer: >60 mL/min (ref >60)
GFR calc non Af Amer: >60 mL/min (ref >60)
Glucose, Bld: 134 mg/dL — ABNORMAL HIGH (ref 65–99)
Potassium: 4.5 mmol/L (ref 3.5–5.1)
SODIUM: 138 mmol/L (ref 135–145)
Total Bilirubin: 0.3 mg/dL (ref 0.3–1.2)
Total Protein: 6.8 g/dL (ref 6.5–8.1)

## 2016-12-10 LAB — ABO/RH: ABO/RH(D): O NEG

## 2016-12-10 LAB — APTT: aPTT: 38 seconds — ABNORMAL HIGH (ref 24–36)

## 2016-12-10 NOTE — Progress Notes (Signed)
12-10-16 CMP, PT, PTT, and PCR results routed to Dr. Lequita HaltAluisio for review.

## 2016-12-16 ENCOUNTER — Inpatient Hospital Stay (HOSPITAL_COMMUNITY): Payer: BLUE CROSS/BLUE SHIELD

## 2016-12-16 ENCOUNTER — Inpatient Hospital Stay (HOSPITAL_COMMUNITY): Payer: BLUE CROSS/BLUE SHIELD | Admitting: Certified Registered Nurse Anesthetist

## 2016-12-16 ENCOUNTER — Inpatient Hospital Stay (HOSPITAL_COMMUNITY)
Admission: RE | Admit: 2016-12-16 | Discharge: 2016-12-17 | DRG: 470 | Disposition: A | Discharge status: Home / Self Care | Payer: BLUE CROSS/BLUE SHIELD | Source: Ambulatory Visit | Attending: Orthopedic Surgery | Admitting: Orthopedic Surgery

## 2016-12-16 ENCOUNTER — Encounter (HOSPITAL_COMMUNITY)
Admission: RE | Disposition: A | Discharge status: Home / Self Care | Payer: Self-pay | Source: Ambulatory Visit | Attending: Orthopedic Surgery

## 2016-12-16 ENCOUNTER — Encounter (HOSPITAL_COMMUNITY): Payer: Self-pay | Admitting: Certified Registered Nurse Anesthetist

## 2016-12-16 ENCOUNTER — Other Ambulatory Visit: Payer: Self-pay

## 2016-12-16 DIAGNOSIS — Z7901 Long term (current) use of anticoagulants: Secondary | ICD-10-CM | POA: Diagnosis not present

## 2016-12-16 DIAGNOSIS — I252 Old myocardial infarction: Secondary | ICD-10-CM | POA: Diagnosis not present

## 2016-12-16 DIAGNOSIS — E785 Hyperlipidemia, unspecified: Secondary | ICD-10-CM | POA: Diagnosis not present

## 2016-12-16 DIAGNOSIS — M1611 Unilateral primary osteoarthritis, right hip: Principal | ICD-10-CM | POA: Diagnosis present

## 2016-12-16 DIAGNOSIS — Z955 Presence of coronary angioplasty implant and graft: Secondary | ICD-10-CM

## 2016-12-16 DIAGNOSIS — S83232A Complex tear of medial meniscus, current injury, left knee, initial encounter: Secondary | ICD-10-CM | POA: Diagnosis not present

## 2016-12-16 DIAGNOSIS — Z8249 Family history of ischemic heart disease and other diseases of the circulatory system: Secondary | ICD-10-CM | POA: Diagnosis not present

## 2016-12-16 DIAGNOSIS — Z8261 Family history of arthritis: Secondary | ICD-10-CM

## 2016-12-16 DIAGNOSIS — M25551 Pain in right hip: Secondary | ICD-10-CM | POA: Diagnosis not present

## 2016-12-16 DIAGNOSIS — M109 Gout, unspecified: Secondary | ICD-10-CM | POA: Diagnosis not present

## 2016-12-16 DIAGNOSIS — I2510 Atherosclerotic heart disease of native coronary artery without angina pectoris: Secondary | ICD-10-CM | POA: Diagnosis present

## 2016-12-16 DIAGNOSIS — Z86711 Personal history of pulmonary embolism: Secondary | ICD-10-CM | POA: Diagnosis not present

## 2016-12-16 DIAGNOSIS — Z96649 Presence of unspecified artificial hip joint: Secondary | ICD-10-CM

## 2016-12-16 DIAGNOSIS — E7800 Pure hypercholesterolemia, unspecified: Secondary | ICD-10-CM | POA: Diagnosis not present

## 2016-12-16 DIAGNOSIS — Z809 Family history of malignant neoplasm, unspecified: Secondary | ICD-10-CM

## 2016-12-16 DIAGNOSIS — Z86718 Personal history of other venous thrombosis and embolism: Secondary | ICD-10-CM | POA: Diagnosis not present

## 2016-12-16 DIAGNOSIS — M169 Osteoarthritis of hip, unspecified: Secondary | ICD-10-CM | POA: Diagnosis present

## 2016-12-16 DIAGNOSIS — I10 Essential (primary) hypertension: Secondary | ICD-10-CM | POA: Diagnosis present

## 2016-12-16 DIAGNOSIS — M1712 Unilateral primary osteoarthritis, left knee: Secondary | ICD-10-CM | POA: Diagnosis not present

## 2016-12-16 HISTORY — PX: TOTAL HIP ARTHROPLASTY: SHX124

## 2016-12-16 HISTORY — PX: INJECTION KNEE: SHX2446

## 2016-12-16 LAB — COMPREHENSIVE METABOLIC PANEL
ALBUMIN: 3.8 g/dL (ref 3.5–5.0)
ALT: 26 U/L (ref 17–63)
ANION GAP: 7 (ref 5–15)
AST: 29 U/L (ref 15–41)
Alkaline Phosphatase: 49 U/L (ref 38–126)
BUN: 24 mg/dL — AB (ref 6–20)
CHLORIDE: 105 mmol/L (ref 101–111)
CO2: 25 mmol/L (ref 22–32)
Calcium: 8.4 mg/dL — ABNORMAL LOW (ref 8.9–10.3)
Creatinine, Ser: 1.18 mg/dL (ref 0.61–1.24)
GFR calc Af Amer: >60 mL/min (ref >60)
GFR calc non Af Amer: >60 mL/min (ref >60)
GLUCOSE: 162 mg/dL — AB (ref 65–99)
POTASSIUM: 4.6 mmol/L (ref 3.5–5.1)
SODIUM: 137 mmol/L (ref 135–145)
Total Bilirubin: 0.6 mg/dL (ref 0.3–1.2)
Total Protein: 6.3 g/dL — ABNORMAL LOW (ref 6.5–8.1)

## 2016-12-16 LAB — CBC
HCT: 41.6 % (ref 39.0–52.0)
Hemoglobin: 13.7 g/dL (ref 13.0–17.0)
MCH: 32.6 pg (ref 26.0–34.0)
MCHC: 32.9 g/dL (ref 30.0–36.0)
MCV: 99 fL (ref 78.0–100.0)
Platelets: 160 10*3/uL (ref 150–400)
RBC: 4.2 MIL/uL — ABNORMAL LOW (ref 4.22–5.81)
RDW: 13.2 % (ref 11.5–15.5)
WBC: 18.5 10*3/uL — AB (ref 4.0–10.5)

## 2016-12-16 LAB — PROTIME-INR
INR: 1.07
Prothrombin Time: 13.8 seconds (ref 11.4–15.2)

## 2016-12-16 LAB — APTT: APTT: 25 s (ref 24–36)

## 2016-12-16 LAB — TYPE AND SCREEN
ABO/RH(D): O NEG
Antibody Screen: NEGATIVE

## 2016-12-16 SURGERY — ARTHROPLASTY, HIP, TOTAL, ANTERIOR APPROACH
Anesthesia: General | Site: Knee | Laterality: Right

## 2016-12-16 MED ORDER — OXYCODONE HCL 5 MG PO TABS: 5.0000 mg | ORAL_TABLET | Freq: Once | ORAL | Status: DC | PRN

## 2016-12-16 MED ORDER — METHYLPREDNISOLONE ACETATE 40 MG/ML IJ SUSP
INTRAMUSCULAR | Status: AC
  Filled 2016-12-16: qty 1

## 2016-12-16 MED ORDER — ONDANSETRON HCL 4 MG/2ML IJ SOLN
4.0000 mg | Freq: Four times a day (QID) | INTRAMUSCULAR | Status: DC | PRN
  Administered 2016-12-16 – 2016-12-17 (×2): 4 mg via INTRAVENOUS
  Filled 2016-12-16 (×2): qty 2

## 2016-12-16 MED ORDER — TRANEXAMIC ACID 1000 MG/10ML IV SOLN
INTRAVENOUS | Status: DC | PRN
  Administered 2016-12-16: 2000 mg via TOPICAL

## 2016-12-16 MED ORDER — BISACODYL 10 MG RE SUPP: 10.0000 mg | Freq: Every day | RECTAL | Status: DC | PRN

## 2016-12-16 MED ORDER — MORPHINE SULFATE (PF) 4 MG/ML IV SOLN: 1.0000 mg | INTRAVENOUS | Status: DC | PRN

## 2016-12-16 MED ORDER — DIPHENHYDRAMINE HCL 12.5 MG/5ML PO ELIX
12.5000 mg | ORAL_SOLUTION | ORAL | Status: DC | PRN
Start: 2016-12-16 — End: 2016-12-17

## 2016-12-16 MED ORDER — LABETALOL HCL 5 MG/ML IV SOLN
INTRAVENOUS | Status: DC | PRN
  Administered 2016-12-16: 5 mg via INTRAVENOUS

## 2016-12-16 MED ORDER — LACTATED RINGERS IV SOLN
INTRAVENOUS | Status: DC
  Administered 2016-12-16: 12:00:00 via INTRAVENOUS
  Administered 2016-12-16: 1000 mL via INTRAVENOUS

## 2016-12-16 MED ORDER — RIVAROXABAN 10 MG PO TABS
10.0000 mg | ORAL_TABLET | Freq: Every day | ORAL | Status: DC
  Administered 2016-12-17: 10 mg via ORAL
  Filled 2016-12-16: qty 1

## 2016-12-16 MED ORDER — FENTANYL CITRATE (PF) 100 MCG/2ML IJ SOLN
INTRAMUSCULAR | Status: AC
  Filled 2016-12-16: qty 2

## 2016-12-16 MED ORDER — GLYCOPYRROLATE 0.2 MG/ML IV SOSY
PREFILLED_SYRINGE | INTRAVENOUS | Status: AC
  Filled 2016-12-16: qty 5

## 2016-12-16 MED ORDER — LIDOCAINE 2% (20 MG/ML) 5 ML SYRINGE
INTRAMUSCULAR | Status: DC | PRN
  Administered 2016-12-16: 100 mg via INTRAVENOUS

## 2016-12-16 MED ORDER — 0.9 % SODIUM CHLORIDE (POUR BTL) OPTIME
TOPICAL | Status: DC | PRN
  Administered 2016-12-16: 1000 mL

## 2016-12-16 MED ORDER — ROSUVASTATIN CALCIUM 10 MG PO TABS
10.0000 mg | ORAL_TABLET | Freq: Every day | ORAL | Status: DC
  Administered 2016-12-17: 10 mg via ORAL
  Filled 2016-12-16: qty 1

## 2016-12-16 MED ORDER — HYDROMORPHONE HCL 1 MG/ML IJ SOLN
0.2500 mg | INTRAMUSCULAR | Status: DC | PRN
  Administered 2016-12-16 (×2): 0.5 mg via INTRAVENOUS

## 2016-12-16 MED ORDER — FENTANYL CITRATE (PF) 100 MCG/2ML IJ SOLN
INTRAMUSCULAR | Status: DC | PRN
  Administered 2016-12-16 (×5): 25 ug via INTRAVENOUS
  Administered 2016-12-16: 100 ug via INTRAVENOUS
  Administered 2016-12-16: 25 ug via INTRAVENOUS
  Administered 2016-12-16: 50 ug via INTRAVENOUS

## 2016-12-16 MED ORDER — EPHEDRINE 5 MG/ML INJ
INTRAVENOUS | Status: AC
  Filled 2016-12-16: qty 10

## 2016-12-16 MED ORDER — CEFAZOLIN SODIUM-DEXTROSE 2-4 GM/100ML-% IV SOLN
2.0000 g | INTRAVENOUS | Status: AC
  Administered 2016-12-16: 2 g via INTRAVENOUS

## 2016-12-16 MED ORDER — ACETAMINOPHEN 325 MG PO TABS: 650.0000 mg | ORAL_TABLET | ORAL | Status: DC | PRN

## 2016-12-16 MED ORDER — PROMETHAZINE HCL 25 MG/ML IJ SOLN: 6.2500 mg | INTRAMUSCULAR | Status: DC | PRN

## 2016-12-16 MED ORDER — TRAMADOL HCL 50 MG PO TABS: 50.0000 mg | ORAL_TABLET | Freq: Four times a day (QID) | ORAL | Status: DC | PRN

## 2016-12-16 MED ORDER — ACETAMINOPHEN 500 MG PO TABS
1000.0000 mg | ORAL_TABLET | Freq: Four times a day (QID) | ORAL | Status: DC
  Administered 2016-12-16 – 2016-12-17 (×3): 1000 mg via ORAL
  Filled 2016-12-16 (×3): qty 2

## 2016-12-16 MED ORDER — STERILE WATER FOR IRRIGATION IR SOLN
Status: DC | PRN
  Administered 2016-12-16: 2000 mL

## 2016-12-16 MED ORDER — HYDROMORPHONE HCL 1 MG/ML IJ SOLN
INTRAMUSCULAR | Status: AC
  Filled 2016-12-16: qty 2

## 2016-12-16 MED ORDER — PROPOFOL 10 MG/ML IV BOLUS
INTRAVENOUS | Status: AC
  Filled 2016-12-16: qty 20

## 2016-12-16 MED ORDER — ONDANSETRON HCL 4 MG/2ML IJ SOLN
INTRAMUSCULAR | Status: AC
  Filled 2016-12-16: qty 2

## 2016-12-16 MED ORDER — ACETAMINOPHEN 10 MG/ML IV SOLN
1000.0000 mg | Freq: Once | INTRAVENOUS | Status: AC
  Administered 2016-12-16: 1000 mg via INTRAVENOUS

## 2016-12-16 MED ORDER — METOCLOPRAMIDE HCL 5 MG PO TABS: 5.0000 mg | ORAL_TABLET | Freq: Three times a day (TID) | ORAL | Status: DC | PRN

## 2016-12-16 MED ORDER — DEXTROSE 5 % IV SOLN
500.0000 mg | Freq: Four times a day (QID) | INTRAVENOUS | Status: DC | PRN
  Administered 2016-12-16: 500 mg via INTRAVENOUS
  Filled 2016-12-16: qty 550

## 2016-12-16 MED ORDER — PHENOL 1.4 % MT LIQD: 1.0000 | OROMUCOSAL | Status: DC | PRN

## 2016-12-16 MED ORDER — BUPIVACAINE-EPINEPHRINE (PF) 0.25% -1:200000 IJ SOLN
INTRAMUSCULAR | Status: AC
Start: 2016-12-16 — End: ?
  Filled 2016-12-16: qty 30

## 2016-12-16 MED ORDER — METHOCARBAMOL 500 MG PO TABS: 500.0000 mg | ORAL_TABLET | Freq: Four times a day (QID) | ORAL | Status: DC | PRN

## 2016-12-16 MED ORDER — SODIUM CHLORIDE 0.9 % IV SOLN
INTRAVENOUS | Status: DC
  Administered 2016-12-16: 100 mL/h via INTRAVENOUS

## 2016-12-16 MED ORDER — PROPOFOL 10 MG/ML IV BOLUS
INTRAVENOUS | Status: DC | PRN
  Administered 2016-12-16: 200 mg via INTRAVENOUS

## 2016-12-16 MED ORDER — MIDAZOLAM HCL 5 MG/5ML IJ SOLN
INTRAMUSCULAR | Status: DC | PRN
  Administered 2016-12-16: 2 mg via INTRAVENOUS

## 2016-12-16 MED ORDER — HYDROMORPHONE HCL 1 MG/ML IJ SOLN
INTRAMUSCULAR | Status: AC
  Filled 2016-12-16: qty 1

## 2016-12-16 MED ORDER — DEXAMETHASONE SODIUM PHOSPHATE 10 MG/ML IJ SOLN
10.0000 mg | Freq: Once | INTRAMUSCULAR | Status: AC
  Administered 2016-12-17: 10 mg via INTRAVENOUS
  Filled 2016-12-16: qty 1

## 2016-12-16 MED ORDER — HYDROMORPHONE HCL 1 MG/ML IJ SOLN: 0.2500 mg | INTRAMUSCULAR | Status: DC | PRN

## 2016-12-16 MED ORDER — CLOMIPHENE CITRATE 50 MG PO TABS
25.0000 mg | ORAL_TABLET | Freq: Every day | ORAL | Status: DC
  Administered 2016-12-17: 25 mg via ORAL
  Filled 2016-12-16: qty 1

## 2016-12-16 MED ORDER — OXYCODONE HCL 5 MG PO TABS: 5.0000 mg | ORAL_TABLET | ORAL | 0 refills | Status: DC | PRN

## 2016-12-16 MED ORDER — PROPOFOL 10 MG/ML IV BOLUS
INTRAVENOUS | Status: AC
  Filled 2016-12-16: qty 60

## 2016-12-16 MED ORDER — MENTHOL 3 MG MT LOZG: 1.0000 | LOZENGE | OROMUCOSAL | Status: DC | PRN

## 2016-12-16 MED ORDER — METHYLPREDNISOLONE ACETATE 40 MG/ML IJ SUSP
INTRAMUSCULAR | Status: DC | PRN
  Administered 2016-12-16: 80 mg via INTRA_ARTICULAR

## 2016-12-16 MED ORDER — CHLORHEXIDINE GLUCONATE 4 % EX LIQD: 60.0000 mL | Freq: Once | CUTANEOUS | Status: DC

## 2016-12-16 MED ORDER — BUPIVACAINE-EPINEPHRINE (PF) 0.25% -1:200000 IJ SOLN
INTRAMUSCULAR | Status: DC | PRN
  Administered 2016-12-16: 30 mL

## 2016-12-16 MED ORDER — TRAMADOL HCL 50 MG PO TABS: 50.0000 mg | ORAL_TABLET | Freq: Four times a day (QID) | ORAL | 0 refills | Status: DC | PRN

## 2016-12-16 MED ORDER — CEFAZOLIN SODIUM-DEXTROSE 2-4 GM/100ML-% IV SOLN
INTRAVENOUS | Status: AC
  Administered 2016-12-16: 2 g via INTRAVENOUS
  Filled 2016-12-16: qty 100

## 2016-12-16 MED ORDER — DOCUSATE SODIUM 100 MG PO CAPS
100.0000 mg | ORAL_CAPSULE | Freq: Two times a day (BID) | ORAL | Status: DC
  Administered 2016-12-16 – 2016-12-17 (×2): 100 mg via ORAL
  Filled 2016-12-16 (×2): qty 1

## 2016-12-16 MED ORDER — PROMETHAZINE HCL 25 MG/ML IJ SOLN
6.2500 mg | Freq: Four times a day (QID) | INTRAMUSCULAR | Status: DC | PRN
  Filled 2016-12-16: qty 1

## 2016-12-16 MED ORDER — DEXAMETHASONE SODIUM PHOSPHATE 10 MG/ML IJ SOLN
10.0000 mg | Freq: Once | INTRAMUSCULAR | Status: AC
  Administered 2016-12-16: 10 mg via INTRAVENOUS

## 2016-12-16 MED ORDER — ACETAMINOPHEN 10 MG/ML IV SOLN
INTRAVENOUS | Status: AC
  Filled 2016-12-16: qty 100

## 2016-12-16 MED ORDER — OXYCODONE HCL 5 MG PO TABS
10.0000 mg | ORAL_TABLET | ORAL | Status: DC | PRN
  Administered 2016-12-16 – 2016-12-17 (×4): 10 mg via ORAL
  Filled 2016-12-16 (×4): qty 2

## 2016-12-16 MED ORDER — METOCLOPRAMIDE HCL 5 MG/ML IJ SOLN: 5.0000 mg | Freq: Three times a day (TID) | INTRAMUSCULAR | Status: DC | PRN

## 2016-12-16 MED ORDER — ALLOPURINOL 300 MG PO TABS
300.0000 mg | ORAL_TABLET | Freq: Every day | ORAL | Status: DC
  Administered 2016-12-17: 300 mg via ORAL
  Filled 2016-12-16: qty 1

## 2016-12-16 MED ORDER — OXYCODONE HCL 5 MG PO TABS
5.0000 mg | ORAL_TABLET | ORAL | Status: DC | PRN
  Administered 2016-12-16 (×2): 5 mg via ORAL
  Filled 2016-12-16 (×2): qty 1

## 2016-12-16 MED ORDER — OXYCODONE HCL 5 MG/5ML PO SOLN
5.0000 mg | Freq: Once | ORAL | Status: DC | PRN
  Filled 2016-12-16: qty 5

## 2016-12-16 MED ORDER — GLYCOPYRROLATE 0.2 MG/ML IJ SOLN
INTRAMUSCULAR | Status: DC | PRN
  Administered 2016-12-16: 0.2 mg via INTRAVENOUS

## 2016-12-16 MED ORDER — EPHEDRINE SULFATE-NACL 50-0.9 MG/10ML-% IV SOSY
PREFILLED_SYRINGE | INTRAVENOUS | Status: DC | PRN
  Administered 2016-12-16: 5 mg via INTRAVENOUS

## 2016-12-16 MED ORDER — METHOCARBAMOL 500 MG PO TABS: 500.0000 mg | ORAL_TABLET | Freq: Four times a day (QID) | ORAL | 0 refills | Status: DC | PRN

## 2016-12-16 MED ORDER — TRANEXAMIC ACID 1000 MG/10ML IV SOLN
2000.0000 mg | Freq: Once | INTRAVENOUS | Status: DC
  Filled 2016-12-16: qty 20

## 2016-12-16 MED ORDER — DEXAMETHASONE SODIUM PHOSPHATE 10 MG/ML IJ SOLN
INTRAMUSCULAR | Status: AC
  Filled 2016-12-16: qty 1

## 2016-12-16 MED ORDER — LABETALOL HCL 5 MG/ML IV SOLN
INTRAVENOUS | Status: AC
  Filled 2016-12-16: qty 4

## 2016-12-16 MED ORDER — CEFAZOLIN SODIUM-DEXTROSE 2-4 GM/100ML-% IV SOLN
2.0000 g | Freq: Four times a day (QID) | INTRAVENOUS | Status: AC
  Administered 2016-12-16 (×2): 2 g via INTRAVENOUS
  Filled 2016-12-16 (×2): qty 100

## 2016-12-16 MED ORDER — ACETAMINOPHEN 650 MG RE SUPP: 650.0000 mg | RECTAL | Status: DC | PRN

## 2016-12-16 MED ORDER — POLYETHYLENE GLYCOL 3350 17 G PO PACK: 17.0000 g | PACK | Freq: Every day | ORAL | Status: DC | PRN

## 2016-12-16 MED ORDER — ONDANSETRON HCL 4 MG/2ML IJ SOLN
INTRAMUSCULAR | Status: DC | PRN
  Administered 2016-12-16: 4 mg via INTRAVENOUS

## 2016-12-16 MED ORDER — METOPROLOL SUCCINATE ER 25 MG PO TB24
25.0000 mg | ORAL_TABLET | Freq: Every day | ORAL | Status: DC
  Administered 2016-12-17: 25 mg via ORAL
  Filled 2016-12-16: qty 1

## 2016-12-16 MED ORDER — FLEET ENEMA 7-19 GM/118ML RE ENEM: 1.0000 | ENEMA | Freq: Once | RECTAL | Status: DC | PRN

## 2016-12-16 MED ORDER — ONDANSETRON HCL 4 MG PO TABS: 4.0000 mg | ORAL_TABLET | Freq: Four times a day (QID) | ORAL | Status: DC | PRN

## 2016-12-16 MED ORDER — MIDAZOLAM HCL 2 MG/2ML IJ SOLN
INTRAMUSCULAR | Status: AC
  Filled 2016-12-16: qty 2

## 2016-12-16 SURGICAL SUPPLY — 33 items
BAG DECANTER FOR FLEXI CONT (MISCELLANEOUS) ×3 IMPLANT
BAG ZIPLOCK 12X15 (MISCELLANEOUS) IMPLANT
BLADE SAG 18X100X1.27 (BLADE) ×3 IMPLANT
CAPT HIP TOTAL 2 ×3 IMPLANT
CLOTH BEACON ORANGE TIMEOUT ST (SAFETY) ×3 IMPLANT
COVER PERINEAL POST (MISCELLANEOUS) ×3 IMPLANT
COVER SURGICAL LIGHT HANDLE (MISCELLANEOUS) ×3 IMPLANT
DECANTER SPIKE VIAL GLASS SM (MISCELLANEOUS) ×3 IMPLANT
DRAPE STERI IOBAN 125X83 (DRAPES) ×3 IMPLANT
DRAPE U-SHAPE 47X51 STRL (DRAPES) ×6 IMPLANT
DRSG ADAPTIC 3X8 NADH LF (GAUZE/BANDAGES/DRESSINGS) ×3 IMPLANT
DRSG MEPILEX BORDER 4X4 (GAUZE/BANDAGES/DRESSINGS) ×3 IMPLANT
DRSG MEPILEX BORDER 4X8 (GAUZE/BANDAGES/DRESSINGS) ×3 IMPLANT
DURAPREP 26ML APPLICATOR (WOUND CARE) ×3 IMPLANT
ELECT REM PT RETURN 15FT ADLT (MISCELLANEOUS) ×3 IMPLANT
EVACUATOR 1/8 PVC DRAIN (DRAIN) ×3 IMPLANT
GLOVE BIO SURGEON STRL SZ7.5 (GLOVE) ×3 IMPLANT
GLOVE BIO SURGEON STRL SZ8 (GLOVE) ×3 IMPLANT
GLOVE BIOGEL PI IND STRL 8 (GLOVE) ×4 IMPLANT
GLOVE BIOGEL PI INDICATOR 8 (GLOVE) ×2
GOWN STRL REUS W/TWL LRG LVL3 (GOWN DISPOSABLE) ×3 IMPLANT
GOWN STRL REUS W/TWL XL LVL3 (GOWN DISPOSABLE) ×3 IMPLANT
PACK ANTERIOR HIP CUSTOM (KITS) ×3 IMPLANT
STRIP CLOSURE SKIN 1/2X4 (GAUZE/BANDAGES/DRESSINGS) ×6 IMPLANT
SUT ETHIBOND NAB CT1 #1 30IN (SUTURE) ×3 IMPLANT
SUT MNCRL AB 4-0 PS2 18 (SUTURE) ×3 IMPLANT
SUT STRATAFIX 0 PDS 27 VIOLET (SUTURE) ×3
SUT VIC AB 2-0 CT1 27 (SUTURE) ×2
SUT VIC AB 2-0 CT1 TAPERPNT 27 (SUTURE) ×4 IMPLANT
SUTURE STRATFX 0 PDS 27 VIOLET (SUTURE) ×2 IMPLANT
SYR 50ML LL SCALE MARK (SYRINGE) ×3 IMPLANT
TRAY FOLEY W/METER SILVER 16FR (SET/KITS/TRAYS/PACK) ×3 IMPLANT
YANKAUER SUCT BULB TIP 10FT TU (MISCELLANEOUS) ×3 IMPLANT

## 2016-12-16 NOTE — Evaluation (Signed)
Physical Therapy Evaluation Patient Details Name: Roy Yang MRN: [id number] DOB: 11/09/1955 Today's Date: 12/16/2016   History of Present Illness  Pt s/p R THR and L knee cortisone injection.  Pt with hx of PE, DVT, MI and CAD  Clinical Impression  Pt admitted as above and presenting with decreased R LE strength/ROM, post op pain and post op nausea limiting functional mobility.  Pt should progress to dc home with family assist.    Follow Up Recommendations Home health PT    Equipment Recommendations  Rolling walker with 5" wheels;3in1 (PT)    Recommendations for Other Services       Precautions / Restrictions Precautions Precautions: Fall Restrictions Weight Bearing Restrictions: No Other Position/Activity Restrictions: WBAT      Mobility  Bed Mobility Overal bed mobility: Needs Assistance Bed Mobility: Supine to Sit;Sit to Supine     Supine to sit: Min assist Sit to supine: Min assist   General bed mobility comments: cues for sequence and use of L LE to self assist  Transfers Overall transfer level: Needs assistance Equipment used: Rolling walker (2 wheeled) Transfers: Sit to/from Stand Sit to Stand: Min assist         General transfer comment: cues for LE management and use of UEs to self assist  Ambulation/Gait Ambulation/Gait assistance: Min assist Ambulation Distance (Feet): 5 Feet Assistive device: Rolling walker (2 wheeled) Gait Pattern/deviations: Step-to pattern;Decreased step length - right;Decreased step length - left;Shuffle;Trunk flexed Gait velocity: decr Gait velocity interpretation: Below normal speed for age/gender General Gait Details: Pt side stepped up side of bed only - ltd by N&V  Stairs            Wheelchair Mobility    Modified Rankin (Stroke Patients Only)       Balance Overall balance assessment: No apparent balance deficits (not formally assessed)                                            Pertinent Vitals/Pain Pain Assessment: 0-10 Pain Score: 4  Pain Location: R hip Pain Descriptors / Indicators: Aching;Sore Pain Intervention(s): Limited activity within patient's tolerance;Monitored during session;Premedicated before session    Home Living Family/patient expects to be discharged to:: Private residence Living Arrangements: Spouse/significant other Available Help at Discharge: Family Type of Home: House Home Access: Stairs to enter Entrance Stairs-Rails: None Secretary/administratorntrance Stairs-Number of Steps: 2 Home Layout: One level Home Equipment: None      Prior Function Level of Independence: Independent               Hand Dominance        Extremity/Trunk Assessment   Upper Extremity Assessment Upper Extremity Assessment: Overall WFL for tasks assessed    Lower Extremity Assessment Lower Extremity Assessment: RLE deficits/detail       Communication   Communication: No difficulties  Cognition Arousal/Alertness: Awake/alert Behavior During Therapy: WFL for tasks assessed/performed Overall Cognitive Status: Within Functional Limits for tasks assessed                                        General Comments      Exercises     Assessment/Plan    PT Assessment Patient needs continued PT services  PT Problem List Decreased strength;Decreased range of motion;Decreased activity  tolerance;Decreased mobility;Decreased knowledge of use of DME;Pain;Obesity       PT Treatment Interventions DME instruction;Gait training;Stair training;Functional mobility training;Therapeutic activities;Therapeutic exercise;Patient/family education    PT Goals (Current goals can be found in the Care Plan section)  Acute Rehab PT Goals Patient Stated Goal: Regain IND PT Goal Formulation: With patient Time For Goal Achievement: 12/19/16 Potential to Achieve Goals: Good    Frequency 7X/week   Barriers to discharge        Co-evaluation                AM-PAC PT "6 Clicks" Daily Activity  Outcome Measure Difficulty turning over in bed (including adjusting bedclothes, sheets and blankets)?: Unable Difficulty moving from lying on back to sitting on the side of the bed? : Unable Difficulty sitting down on and standing up from a chair with arms (e.g., wheelchair, bedside commode, etc,.)?: Unable Help needed moving to and from a bed to chair (including a wheelchair)?: A Little Help needed walking in hospital room?: A Little Help needed climbing 3-5 steps with a railing? : A Lot 6 Click Score: 11    End of Session Equipment Utilized During Treatment: Gait belt Activity Tolerance: Other (comment)(ltd by nausea) Patient left: in bed;with call bell/phone within reach;with family/visitor present Nurse Communication: Mobility status PT Visit Diagnosis: Unsteadiness on feet (R26.81);Difficulty in walking, not elsewhere classified (R26.2)    Time: [id number] PT Time Calculation (min) (ACUTE ONLY): 37 min   Charges:   PT Evaluation $PT Eval Low Complexity: 1 Low PT Treatments $Therapeutic Activity: 8-22 mins   PT G Codes:        Pg [phone]   Khrystyna Schwalm 12/16/2016, 5:45 PM

## 2016-12-16 NOTE — Addendum Note (Signed)
Addendum  created 12/16/16 1818 by Vanessa Durhamochran, Noris Kulinski Glenn, CRNA   Charge Capture section accepted, Visit diagnoses modified

## 2016-12-16 NOTE — Discharge Instructions (Signed)
° °Dr. Linda Grimmer °Total Joint Specialist °[facility] °[address] °[place], [place] [address] °[phone] ° °ANTERIOR APPROACH TOTAL HIP REPLACEMENT POSTOPERATIVE DIRECTIONS ° ° °Hip Rehabilitation, Guidelines Following Surgery  °The results of a hip operation are greatly improved after range of motion and muscle strengthening exercises. Follow all safety measures which are given to protect your hip. If any of these exercises cause increased pain or swelling in your joint, decrease the amount until you are comfortable again. Then slowly increase the exercises. Call your caregiver if you have problems or questions.  ° °HOME CARE INSTRUCTIONS  °Remove items at home which could result in a fall. This includes throw rugs or furniture in walking pathways.  °· ICE to the affected hip every three hours for 30 minutes at a time and then as needed for pain and swelling.  Continue to use ice on the hip for pain and swelling from surgery. You may notice swelling that will progress down to the foot and ankle.  This is normal after surgery.  Elevate the leg when you are not up walking on it.   °· Continue to use the breathing machine which will help keep your temperature down.  It is common for your temperature to cycle up and down following surgery, especially at night when you are not up moving around and exerting yourself.  The breathing machine keeps your lungs expanded and your temperature down. ° ° °DIET °You may resume your previous home diet once your are discharged from the hospital. ° °DRESSING / WOUND CARE / SHOWERING °You may start showering once you are discharged home but do not submerge the incision under water. Just pat the incision dry and apply a dry gauze dressing on daily. °Change the surgical dressing daily and reapply a dry dressing each time. ° °ACTIVITY °Walk with your walker as instructed. °Use walker as long as suggested by your caregivers. °Avoid periods of inactivity  such as sitting longer than an hour when not asleep. This helps prevent blood clots.  °You may resume a sexual relationship in one month or when given the OK by your doctor.  °You may return to work once you are cleared by your doctor.  °Do not drive a car for 6 weeks or until released by you surgeon.  °Do not drive while taking narcotics. ° °WEIGHT BEARING °Weight bearing as tolerated with assist device (walker, cane, etc) as directed, use it as long as suggested by your surgeon or therapist, typically at least 4-6 weeks. ° °POSTOPERATIVE CONSTIPATION PROTOCOL °Constipation - defined medically as fewer than three stools per week and severe constipation as less than one stool per week. ° °One of the most common issues patients have following surgery is constipation.  Even if you have a regular bowel pattern at home, your normal regimen is likely to be disrupted due to multiple reasons following surgery.  Combination of anesthesia, postoperative narcotics, change in appetite and fluid intake all can affect your bowels.  In order to avoid complications following surgery, here are some recommendations in order to help you during your recovery period. ° °Colace (docusate) - Pick up an over-the-counter form of Colace or another stool softener and take twice a day as long as you are requiring postoperative pain medications.  Take with a full glass of water daily.  If you experience loose stools or diarrhea, hold the colace until you stool forms back up.  If your symptoms do not get better within 1   week or if they get worse, check with your doctor. ° °Dulcolax (bisacodyl) - Pick up over-the-counter and take as directed by the product packaging as needed to assist with the movement of your bowels.  Take with a full glass of water.  Use this product as needed if not relieved by Colace only.  ° °MiraLax (polyethylene glycol) - Pick up over-the-counter to have on hand.  MiraLax is a solution that will increase the amount of  water in your bowels to assist with bowel movements.  Take as directed and can mix with a glass of water, juice, soda, coffee, or tea.  Take if you go more than two days without a movement. °Do not use MiraLax more than once per day. Call your doctor if you are still constipated or irregular after using this medication for 7 days in a row. ° °If you continue to have problems with postoperative constipation, please contact the office for further assistance and recommendations.  If you experience "the worst abdominal pain ever" or develop nausea or vomiting, please contact the office immediatly for further recommendations for treatment. ° °ITCHING ° If you experience itching with your medications, try taking only a single pain pill, or even half a pain pill at a time.  You can also use Benadryl over the counter for itching or also to help with sleep.  ° °TED HOSE STOCKINGS °Wear the elastic stockings on both legs for three weeks following surgery during the day but you may remove then at night for sleeping. ° °MEDICATIONS °See your medication summary on the “After Visit Summary” that the nursing staff will review with you prior to discharge.  You may have some home medications which will be placed on hold until you complete the course of blood thinner medication.  It is important for you to complete the blood thinner medication as prescribed by your surgeon.  Continue your approved medications as instructed at time of discharge. ° °PRECAUTIONS °If you experience chest pain or shortness of breath - call 911 immediately for transfer to the hospital emergency department.  °If you develop a fever greater that 101 F, purulent drainage from wound, increased redness or drainage from wound, foul odor from the wound/dressing, or calf pain - CONTACT YOUR SURGEON.   °                                                °FOLLOW-UP APPOINTMENTS °Make sure you keep all of your appointments after your operation with your surgeon and  caregivers. You should call the office at the above phone number and make an appointment for approximately two weeks after the date of your surgery or on the date instructed by your surgeon outlined in the "After Visit Summary". ° °RANGE OF MOTION AND STRENGTHENING EXERCISES  °These exercises are designed to help you keep full movement of your hip joint. Follow your caregiver's or physical therapist's instructions. Perform all exercises about fifteen times, three times per day or as directed. Exercise both hips, even if you have had only one joint replacement. These exercises can be done on a training (exercise) mat, on the floor, on a table or on a bed. Use whatever works the best and is most comfortable for you. Use music or television while you are exercising so that the exercises are a pleasant break in your day. This   will make your life better with the exercises acting as a break in routine you can look forward to.  °Lying on your back, slowly slide your foot toward your buttocks, raising your knee up off the floor. Then slowly slide your foot back down until your leg is straight again.  °Lying on your back spread your legs as far apart as you can without causing discomfort.  °Lying on your side, raise your upper leg and foot straight up from the floor as far as is comfortable. Slowly lower the leg and repeat.  °Lying on your back, tighten up the muscle in the front of your thigh (quadriceps muscles). You can do this by keeping your leg straight and trying to raise your heel off the floor. This helps strengthen the largest muscle supporting your knee.  °Lying on your back, tighten up the muscles of your buttocks both with the legs straight and with the knee bent at a comfortable angle while keeping your heel on the floor.  ° °IF YOU ARE TRANSFERRED TO A SKILLED REHAB FACILITY °If the patient is transferred to a skilled rehab facility following release from the hospital, a list of the current medications will be  sent to the facility for the patient to continue.  When discharged from the skilled rehab facility, please have the facility set up the patient's Home Health Physical Therapy prior to being released. Also, the skilled facility will be responsible for providing the patient with their medications at time of release from the facility to include their pain medication, the muscle relaxants, and their blood thinner medication. If the patient is still at the rehab facility at time of the two week follow up appointment, the skilled rehab facility will also need to assist the patient in arranging follow up appointment in our office and any transportation needs. ° °MAKE SURE YOU:  °Understand these instructions.  °Get help right away if you are not doing well or get worse.  ° ° °Pick up stool softner and laxative for home use following surgery while on pain medications. °Do not submerge incision under water. °Please use good hand washing techniques while changing dressing each day. °May shower starting three days after surgery. °Please use a clean towel to pat the incision dry following showers. °Continue to use ice for pain and swelling after surgery. °Do not use any lotions or creams on the incision until instructed by your surgeon. °

## 2016-12-16 NOTE — Anesthesia Postprocedure Evaluation (Signed)
Anesthesia Post Note  Patient: Epimenio SarinJeffrey A Rahl  Procedure(s) Performed: RIGHT TOTAL HIP ARTHROPLASTY ANTERIOR APPROACH (Right Hip) KNEE INJECTION (Left Knee)     Patient location during evaluation: PACU Anesthesia Type: General Level of consciousness: awake and alert Pain management: pain level controlled Vital Signs Assessment: post-procedure vital signs reviewed and stable Respiratory status: spontaneous breathing, nonlabored ventilation, respiratory function stable and patient connected to nasal cannula oxygen Cardiovascular status: blood pressure returned to baseline and stable Postop Assessment: no apparent nausea or vomiting Anesthetic complications: no    Last Vitals:  Vitals:   12/16/16 1415 12/16/16 1430  BP: 117/89 120/64  Pulse: 71 65  Resp: (!) 25 20  Temp: 36.8 C 36.5 C  SpO2: 98% 94%    Last Pain:  Vitals:   12/16/16 1430  TempSrc: Oral  PainSc: 3                  Chondra Boyde P Destine Ambroise

## 2016-12-16 NOTE — Anesthesia Preprocedure Evaluation (Addendum)
Anesthesia Evaluation  Patient identified by MRN, date of birth, ID band Patient awake    Reviewed: Allergy & Precautions, NPO status , Patient's Chart, lab work & pertinent test results, reviewed documented beta blocker date and time   Airway Mallampati: III  TM Distance: >3 FB Neck ROM: Full    Dental no notable dental hx.    Pulmonary PE   Pulmonary exam normal breath sounds clear to auscultation       Cardiovascular hypertension, Pt. on home beta blockers + CAD, + Past MI and + Cardiac Stents (x 1, 8 years ago )  Normal cardiovascular exam Rhythm:Regular Rate:Normal  Nuclear stress EF: 50%. There was no ST segment deviation noted during stress. Findings consistent with prior myocardial infarction. This is a low risk study. The left ventricular ejection fraction is mildly decreased (45-54%).   1. EF 50% with apical hypokinesis.  2. Fixed small, moderate-intensity apical perfusion defect. This suggests prior infarction.  No ischemia.   Low risk study.   ECG: NSR, rate 64  Sees cardiologist Katrinka Blazing([facility])   Neuro/Psych Depression negative neurological ROS     GI/Hepatic negative GI ROS, Neg liver ROS,   Endo/Other  negative endocrine ROS  Renal/GU negative Renal ROS     Musculoskeletal  (+) Arthritis , Osteoarthritis,    Abdominal (+) + obese,   Peds  Hematology negative hematology ROS (+)   Anesthesia Other Findings DVT lower extremity recurrent Gout HLD  Reproductive/Obstetrics                            Anesthesia Physical Anesthesia Plan  ASA: II  Anesthesia Plan: General   Post-op Pain Management:    Induction: Intravenous  PONV Risk Score and Plan: 2 and Dexamethasone, Ondansetron and Midazolam  Airway Management Planned: LMA  Additional Equipment:   Intra-op Plan:   Post-operative Plan: Extubation in OR  Informed Consent: I have reviewed the patients History and  Physical, chart, labs and discussed the procedure including the risks, benefits and alternatives for the proposed anesthesia with the patient or authorized representative who has indicated his/her understanding and acceptance.   Dental advisory given  Plan Discussed with: CRNA  Anesthesia Plan Comments:       Anesthesia Quick Evaluation

## 2016-12-16 NOTE — Transfer of Care (Signed)
Immediate Anesthesia Transfer of Care Note  Patient: Roy Yang  Procedure(s) Performed: RIGHT TOTAL HIP ARTHROPLASTY ANTERIOR APPROACH (Right Hip) KNEE INJECTION (Left Knee)  Patient Location: PACU  Anesthesia Type:General  Level of Consciousness: awake and patient cooperative  Airway & Oxygen Therapy: Patient Spontanous Breathing and Patient connected to face mask  Post-op Assessment: Report given to RN and Patient moving all extremities  Post vital signs: Reviewed and stable  Last Vitals:  Vitals:   12/16/16 0924  BP: 140/79  Pulse: (!) 56  Resp: 18  Temp: (!) 36.4 C  SpO2: 99%    Last Pain:  Vitals:   12/16/16 0948  TempSrc:   PainSc: 4       Patients Stated Pain Goal: 4 (12/16/16 0948)  Complications: No apparent anesthesia complications

## 2016-12-16 NOTE — Op Note (Signed)
OPERATIVE REPORT- TOTAL HIP ARTHROPLASTY   PREOPERATIVE DIAGNOSIS: Osteoarthritis of the Right hip.      Osteoarthritis left knee  POSTOPERATIVE DIAGNOSIS: Osteoarthritis of the Right  hip.      Osteoarthritis left knee  PROCEDURE: Right total hip arthroplasty, anterior approach.      Left knee cortisone injection  SURGEON: Ollen GrossFrank Senaya Dicenso, MD   ASSISTANT: Avel Peacerew Perkins, PA-C  ANESTHESIA:  General  ESTIMATED BLOOD LOSS:-700 mL    DRAINS: Hemovac x1.   COMPLICATIONS: None   CONDITION: PACU - hemodynamically stable.   BRIEF CLINICAL NOTE: Roy Yang is a 61 y.o. male who has advanced end-  stage arthritis of their Right  hip with progressively worsening pain and  dysfunction.The patient has failed nonoperative management and presents for  total hip arthroplasty. He also has symptomatic osteoarthritis of his left knee and requests cortisone injection.  PROCEDURE IN DETAIL: After successful administration of spinal  anesthetic, the traction boots for the [facility] bed were placed on both  feet and the [facility], boots placed into the leg  holders. The Right hip was then isolated from the perineum with plastic  drapes and prepped and draped in the usual sterile fashion. ASIS and  greater trochanter were marked and a oblique incision was made, starting  at about 1 cm lateral and 2 cm distal to the ASIS and coursing towards  the anterior cortex of the femur. The skin was cut with a 10 blade  through subcutaneous tissue to the level of the fascia overlying the  tensor fascia lata muscle. The fascia was then incised in line with the  incision at the junction of the anterior third and posterior 2/3rd. The  muscle was teased off the fascia and then the interval between the TFL  and the rectus was developed. The Hohmann retractor was then placed at  the top of the femoral neck over the capsule. The vessels overlying the  capsule were cauterized and the  fat on top of the capsule was removed.  A Hohmann retractor was then placed anterior underneath the rectus  femoris to give exposure to the entire anterior capsule. A T-shaped  capsulotomy was performed. The edges were tagged and the femoral head  was identified.       Osteophytes are removed off the superior acetabulum.  The femoral neck was then cut in situ with an oscillating saw. Traction  was then applied to the left lower extremity [facility] the [facility]  traction. The femoral head was then removed. Retractors were placed  around the acetabulum and then circumferential removal of the labrum was  performed. Osteophytes were also removed. Reaming starts at 49 mm to  medialize and  Increased in 2 mm increments to 55 mm. We reamed in  approximately 40 degrees of abduction, 20 degrees anteversion. A 56 mm  pinnacle acetabular shell was then impacted in anatomic position under  fluoroscopic guidance with excellent purchase. We did not need to place  any additional dome screws. A 36 mm neutral + 4 marathon liner was then  placed into the acetabular shell.       The femoral lift was then placed along the lateral aspect of the femur  just distal to the vastus ridge. The leg was  externally rotated and capsule  was stripped off the inferior aspect of the femoral neck down to the  level of the lesser trochanter, this was done with electrocautery. The femur  was lifted after this was performed. The  leg was then placed in an extended and adducted position essentially delivering the femur. We also removed the capsule superiorly and the piriformis from the piriformis fossa to gain excellent exposure of the  proximal femur. Rongeur was used to remove some cancellous bone to get  into the lateral portion of the proximal femur for placement of the  initial starter reamer. The starter broaches was placed  the starter broach  and was shown to go down the center of the canal. Broaching  with the  Corail system  was then performed starting at size 8, coursing  Up to size 11. A size 11 had excellent torsional and rotational  and axial stability. The trial high offset neck was then placed  with a 36 + 1.5 trial head. The hip was then reduced. We confirmed that  the stem was in the canal both on AP and lateral x-rays. It also has excellent sizing. The hip was reduced with outstanding stability through full extension and full external rotation.. AP pelvis was taken and the leg lengths were measured and found to be equal. Hip was then dislocated again and the femoral head and neck removed. The  femoral broach was removed. Size 11 Corail stem with a high offset  neck was then impacted into the femur following native anteversion. Has  excellent purchase in the canal. Excellent torsional and rotational and  axial stability. It is confirmed to be in the canal on AP and lateral  fluoroscopic views. The 36 + 1.5 ceramic head was placed and the hip  reduced with outstanding stability. Again AP pelvis was taken and it  confirmed that the leg lengths were equal. The wound was then copiously  irrigated with saline solution and the capsule reattached and repaired  with Ethibond suture. 30 ml of .25% Bupivicaine was  injected into the capsule and into the edge of the tensor fascia lata as well as subcutaneous tissue. The fascia overlying the tensor fascia lata was then closed with a running #1 V-Loc. Subcu was closed with interrupted 2-0 Vicryl and subcuticular running 4-0 Monocryl. Incision was cleaned  and dried. Steri-Strips and a bulky sterile dressing applied. Hemovac  drain was hooked to suction.      I then prepped the left knee with betadine and injected it with 80 mg of Depomedrol without problems. The patient was then awakened and transported to  recovery in stable condition.        Please note that a surgical assistant was a medical necessity for this procedure to perform it in a safe and expeditious manner.  Assistant was necessary to provide appropriate retraction of vital neurovascular structures and to prevent femoral fracture and allow for anatomic placement of the prosthesis.  Ollen GrossFrank Gittel Mccamish, M.D.

## 2016-12-16 NOTE — Anesthesia Procedure Notes (Signed)
Procedure Name: LMA Insertion Date/Time: 12/16/2016 11:03 AM Performed by: Vanessa Durhamochran, Shaheem Pichon Glenn, CRNA Pre-anesthesia Checklist: Emergency Drugs available, Patient identified, Suction available and Patient being monitored Patient Re-evaluated:Patient Re-evaluated prior to induction Oxygen Delivery Method: Circle system utilized Preoxygenation: Pre-oxygenation with 100% oxygen Induction Type: IV induction Ventilation: Mask ventilation without difficulty LMA: LMA inserted LMA Size: 4.0 Number of attempts: 1 Placement Confirmation: positive ETCO2 and breath sounds checked- equal and bilateral Tube secured with: Tape Dental Injury: Teeth and Oropharynx as per pre-operative assessment

## 2016-12-16 NOTE — Interval H&P Note (Signed)
History and Physical Interval Note:  12/16/2016 9:40 AM  Roy Yang  has presented today for surgery, with the diagnosis of Osteoarthritis Right Hip  The various methods of treatment have been discussed with the patient and family. After consideration of risks, benefits and other options for treatment, the patient has consented to  Procedure(s): RIGHT TOTAL HIP ARTHROPLASTY ANTERIOR APPROACH (Right) as a surgical intervention .  The patient's history has been reviewed, patient examined, no change in status, stable for surgery.  I have reviewed the patient's chart and labs.  Questions were answered to the patient's satisfaction.     [name]

## 2016-12-17 LAB — BASIC METABOLIC PANEL
ANION GAP: 8 (ref 5–15)
BUN: 21 mg/dL — ABNORMAL HIGH (ref 6–20)
CHLORIDE: 102 mmol/L (ref 101–111)
CO2: 26 mmol/L (ref 22–32)
Calcium: 8.2 mg/dL — ABNORMAL LOW (ref 8.9–10.3)
Creatinine, Ser: 1.26 mg/dL — ABNORMAL HIGH (ref 0.61–1.24)
GFR calc Af Amer: >60 mL/min (ref >60)
GFR, EST NON AFRICAN AMERICAN: 59 mL/min — AB (ref >60)
Glucose, Bld: 158 mg/dL — ABNORMAL HIGH (ref 65–99)
POTASSIUM: 4.5 mmol/L (ref 3.5–5.1)
SODIUM: 136 mmol/L (ref 135–145)

## 2016-12-17 LAB — CBC
HCT: 36.4 % — ABNORMAL LOW (ref 39.0–52.0)
HEMOGLOBIN: 12.2 g/dL — AB (ref 13.0–17.0)
MCH: 33.3 pg (ref 26.0–34.0)
MCHC: 33.5 g/dL (ref 30.0–36.0)
MCV: 99.5 fL (ref 78.0–100.0)
PLATELETS: 143 10*3/uL — AB (ref 150–400)
RBC: 3.66 MIL/uL — AB (ref 4.22–5.81)
RDW: 13 % (ref 11.5–15.5)
WBC: 16.3 10*3/uL — AB (ref 4.0–10.5)

## 2016-12-17 MED ORDER — TRAMADOL HCL 50 MG PO TABS: 50.0000 mg | ORAL_TABLET | Freq: Four times a day (QID) | ORAL | Status: DC | PRN

## 2016-12-17 NOTE — Progress Notes (Signed)
Physical Therapy Treatment Patient Details Name: Roy SarinJeffrey A Yang MRN: [id number] DOB: 06/13/1955 Today's Date: 12/17/2016    History of Present Illness Pt s/p R THR and L knee cortisone injection.  Pt with hx of PE, DVT, MI and CAD    PT Comments    Pt progressing well with mobility this am and with no c/o nausea.  Pt hopeful for dc home this pm.   Follow Up Recommendations  Home health PT     Equipment Recommendations  Rolling walker with 5" wheels;3in1 (PT)    Recommendations for Other Services       Precautions / Restrictions Precautions Precautions: Fall Restrictions Weight Bearing Restrictions: No Other Position/Activity Restrictions: WBAT    Mobility  Bed Mobility Overal bed mobility: Needs Assistance Bed Mobility: Supine to Sit     Supine to sit: Min assist     General bed mobility comments: cues for sequence and use of L LE to self assist  Transfers Overall transfer level: Needs assistance Equipment used: Rolling walker (2 wheeled) Transfers: Sit to/from Stand Sit to Stand: Min assist;Min guard         General transfer comment: cues for LE management and use of UEs to self assist  Ambulation/Gait Ambulation/Gait assistance: Min guard Ambulation Distance (Feet): 150 Feet Assistive device: Rolling walker (2 wheeled) Gait Pattern/deviations: Step-to pattern;Decreased step length - right;Decreased step length - left;Shuffle;Trunk flexed Gait velocity: decr Gait velocity interpretation: Below normal speed for age/gender General Gait Details: cues for posture, position from RW and initial sequence   Stairs            Wheelchair Mobility    Modified Rankin (Stroke Patients Only)       Balance Overall balance assessment: No apparent balance deficits (not formally assessed)                                          Cognition Arousal/Alertness: Awake/alert Behavior During Therapy: WFL for tasks  assessed/performed Overall Cognitive Status: Within Functional Limits for tasks assessed                                        Exercises Total Joint Exercises Ankle Circles/Pumps: AROM;Both;15 reps;Supine Quad Sets: AROM;Both;10 reps;Supine Heel Slides: AAROM;Right;20 reps;Supine Hip ABduction/ADduction: AAROM;Right;15 reps;Supine    General Comments        Pertinent Vitals/Pain Pain Assessment: 0-10 Pain Score: 3  Pain Location: R hip Pain Descriptors / Indicators: Aching;Sore Pain Intervention(s): Limited activity within patient's tolerance;Monitored during session;Premedicated before session;Ice applied    Home Living                      Prior Function            PT Goals (current goals can now be found in the care plan section) Acute Rehab PT Goals Patient Stated Goal: Regain IND PT Goal Formulation: With patient Time For Goal Achievement: 12/19/16 Potential to Achieve Goals: Good Progress towards PT goals: Progressing toward goals    Frequency    7X/week      PT Plan Current plan remains appropriate    Co-evaluation              AM-PAC PT "6 Clicks" Daily Activity  Outcome Measure  Difficulty turning over in bed (  including adjusting bedclothes, sheets and blankets)?: A Lot Difficulty moving from lying on back to sitting on the side of the bed? : A Lot Difficulty sitting down on and standing up from a chair with arms (e.g., wheelchair, bedside commode, etc,.)?: A Lot Help needed moving to and from a bed to chair (including a wheelchair)?: A Little Help needed walking in hospital room?: A Little Help needed climbing 3-5 steps with a railing? : A Lot 6 Click Score: 14    End of Session Equipment Utilized During Treatment: Gait belt Activity Tolerance: Patient tolerated treatment well Patient left: in chair;with call bell/phone within reach;with family/visitor present Nurse Communication: Mobility status PT Visit  Diagnosis: Unsteadiness on feet (R26.81);Difficulty in walking, not elsewhere classified (R26.2)     Time: 7829-56210801-0830 PT Time Calculation (min) (ACUTE ONLY): 29 min  Charges:  $Gait Training: 8-22 mins $Therapeutic Exercise: 8-22 mins                    G Codes:       Pg [phone]    Birtie Fellman 12/17/2016, 12:35 PM

## 2016-12-17 NOTE — Progress Notes (Signed)
Physical Therapy Treatment Patient Details Name: Roy SarinJeffrey A Yang MRN: [id number] DOB: 01/22/1956 Today's Date: 12/17/2016    History of Present Illness Pt s/p R THR and L knee cortisone injection.  Pt with hx of PE, DVT, MI and CAD    PT Comments    Pt progressing well with mobility and eager for dc home.  Reviewed stairs, car transfers, shower transfer and therex with written instruction provided.   Follow Up Recommendations  Home health PT     Equipment Recommendations  Rolling walker with 5" wheels;3in1 (PT)    Recommendations for Other Services       Precautions / Restrictions Precautions Precautions: Fall Restrictions Weight Bearing Restrictions: No Other Position/Activity Restrictions: WBAT    Mobility  Bed Mobility Overal bed mobility: Needs Assistance Bed Mobility: Sit to Supine;Supine to Sit     Supine to sit: Min guard Sit to supine: Min guard   General bed mobility comments: cues for sequence and use of L LE to self assist  Transfers Overall transfer level: Needs assistance Equipment used: Rolling walker (2 wheeled) Transfers: Sit to/from Stand Sit to Stand: Min guard;Supervision         General transfer comment: cues for LE management and use of UEs to self assist  Ambulation/Gait Ambulation/Gait assistance: Min guard;Supervision Ambulation Distance (Feet): 120 Feet Assistive device: Rolling walker (2 wheeled) Gait Pattern/deviations: Step-to pattern;Decreased step length - right;Decreased step length - left;Shuffle;Trunk flexed Gait velocity: decr Gait velocity interpretation: Below normal speed for age/gender General Gait Details: cues for posture, position from RW and initial sequence   Stairs Stairs: Yes   Stair Management: No rails;Step to pattern;Backwards;With walker Number of Stairs: 4 General stair comments: 2 stairs twice with cues for sequence and foot/RW placement.  Written instruction provided and reviewed with spouse upon  her arrival  Wheelchair Mobility    Modified Rankin (Stroke Patients Only)       Balance Overall balance assessment: No apparent balance deficits (not formally assessed)                                          Cognition Arousal/Alertness: Awake/alert Behavior During Therapy: WFL for tasks assessed/performed Overall Cognitive Status: Within Functional Limits for tasks assessed                                        Exercises Total Joint Exercises Ankle Circles/Pumps: AROM;Both;15 reps;Supine Quad Sets: AROM;Both;10 reps;Supine Heel Slides: AAROM;Right;20 reps;Supine Hip ABduction/ADduction: AAROM;Right;15 reps;Supine Long Arc Quad: AROM;Left;10 reps;Seated    General Comments        Pertinent Vitals/Pain Pain Assessment: 0-10 Pain Score: 3  Pain Location: R hip Pain Descriptors / Indicators: Aching;Sore Pain Intervention(s): Limited activity within patient's tolerance;Monitored during session;Premedicated before session    Home Living                      Prior Function            PT Goals (current goals can now be found in the care plan section) Acute Rehab PT Goals Patient Stated Goal: Regain IND PT Goal Formulation: With patient Time For Goal Achievement: 12/19/16 Potential to Achieve Goals: Good Progress towards PT goals: Progressing toward goals    Frequency    7X/week  PT Plan Current plan remains appropriate    Co-evaluation              AM-PAC PT "6 Clicks" Daily Activity  Outcome Measure  Difficulty turning over in bed (including adjusting bedclothes, sheets and blankets)?: A Lot Difficulty moving from lying on back to sitting on the side of the bed? : A Lot Difficulty sitting down on and standing up from a chair with arms (e.g., wheelchair, bedside commode, etc,.)?: A Lot Help needed moving to and from a bed to chair (including a wheelchair)?: A Little Help needed walking in hospital  room?: A Little Help needed climbing 3-5 steps with a railing? : A Little 6 Click Score: 15    End of Session Equipment Utilized During Treatment: Gait belt Activity Tolerance: Patient tolerated treatment well Patient left: in chair;with call bell/phone within reach;with family/visitor present Nurse Communication: Mobility status PT Visit Diagnosis: Unsteadiness on feet (R26.81);Difficulty in walking, not elsewhere classified (R26.2)     Time: 4782-95621320-1359 PT Time Calculation (min) (ACUTE ONLY): 39 min  Charges:  $Gait Training: 8-22 mins $Therapeutic Exercise: 8-22 mins $Therapeutic Activity: 8-22 mins                    G Codes:       Pg [phone]    Sharece Fleischhacker 12/17/2016, 4:32 PM

## 2016-12-17 NOTE — Progress Notes (Signed)
Subjective: 1 Day Post-Op Procedure(s) (LRB): RIGHT TOTAL HIP ARTHROPLASTY ANTERIOR APPROACH (Right) KNEE INJECTION (Left) Patient reports pain as mild.    Objective: Vital signs in last 24 hours: Temp:  [97.5 F (36.4 C)-98.7 F (37.1 C)] 98.6 F (37 C) (11/22 0653) Pulse Rate:  [56-77] 67 (11/22 0653) Resp:  [8-25] 16 (11/22 0653) BP: (108-147)/(62-89) 112/64 (11/22 0653) SpO2:  [93 %-99 %] 95 % (11/22 0653) Weight:  [240 lb (108.9 kg)] 240 lb (108.9 kg) (11/21 1430)  Intake/Output from previous day: 11/21 0701 - 11/22 0700 In: 3653.3 [P.O.:360; I.V.:3238.3; IV Piggyback:55] Out: 1350 [Urine:260; Drains:390; Blood:700] Intake/Output this shift: No intake/output data recorded.  Recent Labs    12/16/16 1448 12/17/16 0605  HGB 13.7 12.2*   Recent Labs    12/16/16 1448 12/17/16 0605  WBC 18.5* 16.3*  RBC 4.20* 3.66*  HCT 41.6 36.4*  PLT 160 143*   Recent Labs    12/16/16 1448 12/17/16 0605  NA 137 136  K 4.6 4.5  CL 105 102  CO2 25 26  BUN 24* 21*  CREATININE 1.18 1.26*  GLUCOSE 162* 158*  CALCIUM 8.4* 8.2*   Recent Labs    12/16/16 1448  INR 1.07    Neurologically intact Neurovascular intact No cellulitis present Compartment soft  Assessment/Plan: 1 Day Post-Op Procedure(s) (LRB): RIGHT TOTAL HIP ARTHROPLASTY ANTERIOR APPROACH (Right) KNEE INJECTION (Left) Advance diet Up with therapy D/C IV fluids Discharge home with home health  Roy Yang 12/17/2016, 7:14 AM

## 2016-12-17 NOTE — Care Management Note (Signed)
Case Management Note  Patient Details  Name: Roy Yang MRN: [id number] Date of Birth: 03/17/1955  Subjective/Objective:    Right THA                Action/Plan: Discharge Planning: NCM contacted pt and requested 3n1 and RW for home. [facility] delivered to room. [facility]    Expected Discharge Date:  12/17/16               Expected Discharge Plan:  Home w Home Health Services  In-House Referral:  NA  Discharge planning Services  CM Consult  Post Acute Care Choice:  Home Health Choice offered to:  Patient  DME Arranged:  3-N-1, Walker rolling DME Agency:  [facility]  HH Arranged:  PT HH [facility]  Status of Service:  Completed, signed off  If discussed at [employer] Meetings, dates discussed:    Additional Comments:  Elliot CousinShavis, Joyce Heitman Ellen, RN 12/17/2016, 12:25 PM

## 2016-12-20 DIAGNOSIS — Z86718 Personal history of other venous thrombosis and embolism: Secondary | ICD-10-CM | POA: Diagnosis not present

## 2016-12-20 DIAGNOSIS — G5623 Lesion of ulnar nerve, bilateral upper limbs: Secondary | ICD-10-CM | POA: Diagnosis not present

## 2016-12-20 DIAGNOSIS — S83232D Complex tear of medial meniscus, current injury, left knee, subsequent encounter: Secondary | ICD-10-CM | POA: Diagnosis not present

## 2016-12-20 DIAGNOSIS — Z79891 Long term (current) use of opiate analgesic: Secondary | ICD-10-CM | POA: Diagnosis not present

## 2016-12-20 DIAGNOSIS — Z7901 Long term (current) use of anticoagulants: Secondary | ICD-10-CM | POA: Diagnosis not present

## 2016-12-20 DIAGNOSIS — M109 Gout, unspecified: Secondary | ICD-10-CM | POA: Diagnosis not present

## 2016-12-20 DIAGNOSIS — I2510 Atherosclerotic heart disease of native coronary artery without angina pectoris: Secondary | ICD-10-CM | POA: Diagnosis not present

## 2016-12-20 DIAGNOSIS — Z471 Aftercare following joint replacement surgery: Secondary | ICD-10-CM | POA: Diagnosis not present

## 2016-12-20 DIAGNOSIS — I252 Old myocardial infarction: Secondary | ICD-10-CM | POA: Diagnosis not present

## 2016-12-20 DIAGNOSIS — I10 Essential (primary) hypertension: Secondary | ICD-10-CM | POA: Diagnosis not present

## 2016-12-20 DIAGNOSIS — Z86711 Personal history of pulmonary embolism: Secondary | ICD-10-CM | POA: Diagnosis not present

## 2016-12-20 DIAGNOSIS — E785 Hyperlipidemia, unspecified: Secondary | ICD-10-CM | POA: Diagnosis not present

## 2016-12-20 DIAGNOSIS — Z96641 Presence of right artificial hip joint: Secondary | ICD-10-CM | POA: Diagnosis not present

## 2016-12-22 ENCOUNTER — Other Ambulatory Visit: Payer: Self-pay | Admitting: Internal Medicine

## 2016-12-22 DIAGNOSIS — I10 Essential (primary) hypertension: Secondary | ICD-10-CM | POA: Diagnosis not present

## 2016-12-22 DIAGNOSIS — S83232D Complex tear of medial meniscus, current injury, left knee, subsequent encounter: Secondary | ICD-10-CM | POA: Diagnosis not present

## 2016-12-22 DIAGNOSIS — Z86718 Personal history of other venous thrombosis and embolism: Secondary | ICD-10-CM

## 2016-12-22 DIAGNOSIS — M109 Gout, unspecified: Secondary | ICD-10-CM | POA: Diagnosis not present

## 2016-12-22 DIAGNOSIS — Z7901 Long term (current) use of anticoagulants: Secondary | ICD-10-CM | POA: Diagnosis not present

## 2016-12-22 DIAGNOSIS — Z96641 Presence of right artificial hip joint: Secondary | ICD-10-CM | POA: Diagnosis not present

## 2016-12-22 DIAGNOSIS — Z471 Aftercare following joint replacement surgery: Secondary | ICD-10-CM | POA: Diagnosis not present

## 2016-12-22 DIAGNOSIS — I2510 Atherosclerotic heart disease of native coronary artery without angina pectoris: Secondary | ICD-10-CM | POA: Diagnosis not present

## 2016-12-22 DIAGNOSIS — I2699 Other pulmonary embolism without acute cor pulmonale: Secondary | ICD-10-CM

## 2016-12-22 DIAGNOSIS — E785 Hyperlipidemia, unspecified: Secondary | ICD-10-CM | POA: Diagnosis not present

## 2016-12-22 DIAGNOSIS — Z79891 Long term (current) use of opiate analgesic: Secondary | ICD-10-CM | POA: Diagnosis not present

## 2016-12-22 DIAGNOSIS — I252 Old myocardial infarction: Secondary | ICD-10-CM | POA: Diagnosis not present

## 2016-12-22 DIAGNOSIS — Z86711 Personal history of pulmonary embolism: Secondary | ICD-10-CM | POA: Diagnosis not present

## 2016-12-22 DIAGNOSIS — G5623 Lesion of ulnar nerve, bilateral upper limbs: Secondary | ICD-10-CM | POA: Diagnosis not present

## 2016-12-24 DIAGNOSIS — I252 Old myocardial infarction: Secondary | ICD-10-CM | POA: Diagnosis not present

## 2016-12-24 DIAGNOSIS — I2510 Atherosclerotic heart disease of native coronary artery without angina pectoris: Secondary | ICD-10-CM | POA: Diagnosis not present

## 2016-12-24 DIAGNOSIS — E785 Hyperlipidemia, unspecified: Secondary | ICD-10-CM | POA: Diagnosis not present

## 2016-12-24 DIAGNOSIS — Z7901 Long term (current) use of anticoagulants: Secondary | ICD-10-CM | POA: Diagnosis not present

## 2016-12-24 DIAGNOSIS — G5623 Lesion of ulnar nerve, bilateral upper limbs: Secondary | ICD-10-CM | POA: Diagnosis not present

## 2016-12-24 DIAGNOSIS — Z96641 Presence of right artificial hip joint: Secondary | ICD-10-CM | POA: Diagnosis not present

## 2016-12-24 DIAGNOSIS — S83232D Complex tear of medial meniscus, current injury, left knee, subsequent encounter: Secondary | ICD-10-CM | POA: Diagnosis not present

## 2016-12-24 DIAGNOSIS — Z86718 Personal history of other venous thrombosis and embolism: Secondary | ICD-10-CM | POA: Diagnosis not present

## 2016-12-24 DIAGNOSIS — Z79891 Long term (current) use of opiate analgesic: Secondary | ICD-10-CM | POA: Diagnosis not present

## 2016-12-24 DIAGNOSIS — Z471 Aftercare following joint replacement surgery: Secondary | ICD-10-CM | POA: Diagnosis not present

## 2016-12-24 DIAGNOSIS — Z86711 Personal history of pulmonary embolism: Secondary | ICD-10-CM | POA: Diagnosis not present

## 2016-12-24 DIAGNOSIS — M109 Gout, unspecified: Secondary | ICD-10-CM | POA: Diagnosis not present

## 2016-12-24 DIAGNOSIS — I10 Essential (primary) hypertension: Secondary | ICD-10-CM | POA: Diagnosis not present

## 2016-12-25 DIAGNOSIS — I10 Essential (primary) hypertension: Secondary | ICD-10-CM | POA: Diagnosis not present

## 2016-12-25 DIAGNOSIS — Z96641 Presence of right artificial hip joint: Secondary | ICD-10-CM | POA: Diagnosis not present

## 2016-12-25 DIAGNOSIS — M109 Gout, unspecified: Secondary | ICD-10-CM | POA: Diagnosis not present

## 2016-12-25 DIAGNOSIS — I252 Old myocardial infarction: Secondary | ICD-10-CM | POA: Diagnosis not present

## 2016-12-25 DIAGNOSIS — G5623 Lesion of ulnar nerve, bilateral upper limbs: Secondary | ICD-10-CM | POA: Diagnosis not present

## 2016-12-25 DIAGNOSIS — Z86718 Personal history of other venous thrombosis and embolism: Secondary | ICD-10-CM | POA: Diagnosis not present

## 2016-12-25 DIAGNOSIS — Z471 Aftercare following joint replacement surgery: Secondary | ICD-10-CM | POA: Diagnosis not present

## 2016-12-25 DIAGNOSIS — E785 Hyperlipidemia, unspecified: Secondary | ICD-10-CM | POA: Diagnosis not present

## 2016-12-25 DIAGNOSIS — S83232D Complex tear of medial meniscus, current injury, left knee, subsequent encounter: Secondary | ICD-10-CM | POA: Diagnosis not present

## 2016-12-25 DIAGNOSIS — Z86711 Personal history of pulmonary embolism: Secondary | ICD-10-CM | POA: Diagnosis not present

## 2016-12-25 DIAGNOSIS — Z79891 Long term (current) use of opiate analgesic: Secondary | ICD-10-CM | POA: Diagnosis not present

## 2016-12-25 DIAGNOSIS — Z7901 Long term (current) use of anticoagulants: Secondary | ICD-10-CM | POA: Diagnosis not present

## 2016-12-25 DIAGNOSIS — I2510 Atherosclerotic heart disease of native coronary artery without angina pectoris: Secondary | ICD-10-CM | POA: Diagnosis not present

## 2016-12-27 NOTE — Discharge Summary (Signed)
Physician Discharge Summary   Patient ID: Roy Yang MRN: [id number] DOB/AGE: 08-10-1955 61 y.o.  Admit date: 12/16/2016 Discharge date: 12/17/2016  Primary Diagnosis:  Osteoarthritis of the Right hip.                                                  Osteoarthritis left knee   Admission Diagnoses:  Past Medical History:  Diagnosis Date  . CAD (coronary artery disease)   . Clotting disorder ([place])   . Depression   . DVT, lower extremity, recurrent ([place])   . Gout   . HSV-2 (herpes simplex virus 2) infection   . Hyperlipidemia   . Hypertension   . Myocardial infarction ([place])   . PE (pulmonary embolism) 1998  . Thyroid disease    Discharge Diagnoses:   Principal Problem:   Degenerative arthritis of hip Active Problems:   OA (osteoarthritis) of hip  Estimated body mass index is 34.44 kg/m as calculated from the following:   Height as of this encounter: '5\' 10"'$  (1.778 m).   Weight as of this encounter: 108.9 kg (240 lb).  Procedure(s) (LRB): RIGHT TOTAL HIP ARTHROPLASTY ANTERIOR APPROACH (Right) KNEE INJECTION (Left)   Consults: None  HPI: Roy Yang is a 61 y.o. male who has advanced end-  stage arthritis of their Right  hip with progressively worsening pain and  dysfunction.The patient has failed nonoperative management and presents for  total hip arthroplasty. He also has symptomatic osteoarthritis of his left knee and requests cortisone injection.   Laboratory Data: Admission on 12/16/2016, Discharged on 12/17/2016  Component Date Value Ref Range Status  . aPTT 12/16/2016 25  24 - 36 seconds Final  . WBC 12/16/2016 18.5* 4.0 - 10.5 K/uL Final  . RBC 12/16/2016 4.20* 4.22 - 5.81 MIL/uL Final  . Hemoglobin 12/16/2016 13.7  13.0 - 17.0 g/dL Final  . HCT 12/16/2016 41.6  39.0 - 52.0 % Final  . MCV 12/16/2016 99.0  78.0 - 100.0 fL Final  . MCH 12/16/2016 32.6  26.0 - 34.0 pg Final  . MCHC 12/16/2016 32.9  30.0 - 36.0 g/dL Final  . RDW 12/16/2016 13.2   11.5 - 15.5 % Final  . Platelets 12/16/2016 160  150 - 400 K/uL Final  . Sodium 12/16/2016 137  135 - 145 mmol/L Final  . Potassium 12/16/2016 4.6  3.5 - 5.1 mmol/L Final  . Chloride 12/16/2016 105  101 - 111 mmol/L Final  . CO2 12/16/2016 25  22 - 32 mmol/L Final  . Glucose, Bld 12/16/2016 162* 65 - 99 mg/dL Final  . BUN 12/16/2016 24* 6 - 20 mg/dL Final  . Creatinine, Ser 12/16/2016 1.18  0.61 - 1.24 mg/dL Final  . Calcium 12/16/2016 8.4* 8.9 - 10.3 mg/dL Final  . Total Protein 12/16/2016 6.3* 6.5 - 8.1 g/dL Final  . Albumin 12/16/2016 3.8  3.5 - 5.0 g/dL Final  . AST 12/16/2016 29  15 - 41 U/L Final  . ALT 12/16/2016 26  17 - 63 U/L Final  . Alkaline Phosphatase 12/16/2016 49  38 - 126 U/L Final  . Total Bilirubin 12/16/2016 0.6  0.3 - 1.2 mg/dL Final  . GFR calc non Af Amer 12/16/2016 >60  >60 mL/min Final  . GFR calc Af Amer 12/16/2016 >60  >60 mL/min Final   Comment: (NOTE) The eGFR has been  calculated using the CKD EPI equation. This calculation has not been validated in all clinical situations. eGFR's persistently <60 mL/min signify possible Chronic Kidney Disease.   . Anion gap 12/16/2016 7  5 - 15 Final  . Prothrombin Time 12/16/2016 13.8  11.4 - 15.2 seconds Final  . INR 12/16/2016 1.07   Final  . WBC 12/17/2016 16.3* 4.0 - 10.5 K/uL Final  . RBC 12/17/2016 3.66* 4.22 - 5.81 MIL/uL Final  . Hemoglobin 12/17/2016 12.2* 13.0 - 17.0 g/dL Final  . HCT 12/17/2016 36.4* 39.0 - 52.0 % Final  . MCV 12/17/2016 99.5  78.0 - 100.0 fL Final  . MCH 12/17/2016 33.3  26.0 - 34.0 pg Final  . MCHC 12/17/2016 33.5  30.0 - 36.0 g/dL Final  . RDW 12/17/2016 13.0  11.5 - 15.5 % Final  . Platelets 12/17/2016 143* 150 - 400 K/uL Final  . Sodium 12/17/2016 136  135 - 145 mmol/L Final  . Potassium 12/17/2016 4.5  3.5 - 5.1 mmol/L Final  . Chloride 12/17/2016 102  101 - 111 mmol/L Final  . CO2 12/17/2016 26  22 - 32 mmol/L Final  . Glucose, Bld 12/17/2016 158* 65 - 99 mg/dL Final  . BUN  12/17/2016 21* 6 - 20 mg/dL Final  . Creatinine, Ser 12/17/2016 1.26* 0.61 - 1.24 mg/dL Final  . Calcium 12/17/2016 8.2* 8.9 - 10.3 mg/dL Final  . GFR calc non Af Amer 12/17/2016 59* >60 mL/min Final  . GFR calc Af Amer 12/17/2016 >60  >60 mL/min Final   Comment: (NOTE) The eGFR has been calculated using the CKD EPI equation. This calculation has not been validated in all clinical situations. eGFR's persistently <60 mL/min signify possible Chronic Kidney Disease.   Georgiann Hahn gap 12/17/2016 8  5 - 15 Final  Hospital Outpatient Visit on 12/10/2016  Component Date Value Ref Range Status  . aPTT 12/10/2016 38* 24 - 36 seconds Final   Comment:        IF BASELINE aPTT IS ELEVATED, SUGGEST PATIENT RISK ASSESSMENT BE USED TO DETERMINE APPROPRIATE ANTICOAGULANT THERAPY.   . WBC 12/10/2016 7.3  4.0 - 10.5 K/uL Final  . RBC 12/10/2016 4.68  4.22 - 5.81 MIL/uL Final  . Hemoglobin 12/10/2016 15.5  13.0 - 17.0 g/dL Final  . HCT 12/10/2016 45.9  39.0 - 52.0 % Final  . MCV 12/10/2016 98.1  78.0 - 100.0 fL Final  . MCH 12/10/2016 33.1  26.0 - 34.0 pg Final  . MCHC 12/10/2016 33.8  30.0 - 36.0 g/dL Final  . RDW 12/10/2016 13.0  11.5 - 15.5 % Final  . Platelets 12/10/2016 165  150 - 400 K/uL Final  . Sodium 12/10/2016 138  135 - 145 mmol/L Final  . Potassium 12/10/2016 4.5  3.5 - 5.1 mmol/L Final  . Chloride 12/10/2016 108  101 - 111 mmol/L Final  . CO2 12/10/2016 25  22 - 32 mmol/L Final  . Glucose, Bld 12/10/2016 134* 65 - 99 mg/dL Final  . BUN 12/10/2016 24* 6 - 20 mg/dL Final  . Creatinine, Ser 12/10/2016 1.18  0.61 - 1.24 mg/dL Final  . Calcium 12/10/2016 9.2  8.9 - 10.3 mg/dL Final  . Total Protein 12/10/2016 6.8  6.5 - 8.1 g/dL Final  . Albumin 12/10/2016 3.9  3.5 - 5.0 g/dL Final  . AST 12/10/2016 23  15 - 41 U/L Final  . ALT 12/10/2016 22  17 - 63 U/L Final  . Alkaline Phosphatase 12/10/2016 55  38 - 126 U/L  Final  . Total Bilirubin 12/10/2016 0.3  0.3 - 1.2 mg/dL Final  . GFR calc  non Af Amer 12/10/2016 >60  >60 mL/min Final  . GFR calc Af Amer 12/10/2016 >60  >60 mL/min Final   Comment: (NOTE) The eGFR has been calculated using the CKD EPI equation. This calculation has not been validated in all clinical situations. eGFR's persistently <60 mL/min signify possible Chronic Kidney Disease.   . Anion gap 12/10/2016 5  5 - 15 Final  . Prothrombin Time 12/10/2016 19.1* 11.4 - 15.2 seconds Final  . INR 12/10/2016 1.62   Final  . ABO/RH(D) 12/10/2016 O NEG   Final  . Antibody Screen 12/10/2016 NEG   Final  . Sample Expiration 12/10/2016 12/19/2016   Final  . Extend sample reason 12/10/2016 NO TRANSFUSIONS OR PREGNANCY IN THE PAST 3 MONTHS   Final  . MRSA, PCR 12/10/2016 NEGATIVE  NEGATIVE Final  . Staphylococcus aureus 12/10/2016 POSITIVE* NEGATIVE Final   Comment: (NOTE) The Xpert SA Assay (FDA approved for NASAL specimens in patients 83 years of age and older), is one component of a comprehensive surveillance program. It is not intended to diagnose infection nor to guide or monitor treatment.   . ABO/RH(D) 12/10/2016 O NEG   Final     X-Rays:Dg Pelvis Portable  Result Date: 12/16/2016 CLINICAL DATA:  Status post right hip arthroplasty. EXAM: PORTABLE PELVIS 1-2 VIEWS COMPARISON:  None in PACs FINDINGS: A single AP portable postoperative view of the pelvis and right hip reveals placement of a joint prosthesis. The interface with the native bone appears normal. There is a surgical drain line present. IMPRESSION: No postprocedure complication is observed following right hip joint prosthesis placement. Electronically Signed   By: David  Martinique M.D.   On: 12/16/2016 14:05   Dg C-arm 1-60 Min-no Report  Result Date: 12/16/2016 Fluoroscopy was utilized by the requesting physician.  No radiographic interpretation.    EKG: Orders placed or performed in visit on 05/29/16  . EKG 12-Lead     Hospital Course: Patient was admitted to [facility] and taken to  the OR and underwent the above state procedure without complications.  Patient tolerated the procedure well and was later transferred to the recovery room and then to the orthopaedic floor for postoperative care.  They were given PO and IV analgesics for pain control following their surgery.  They were given 24 hours of postoperative antibiotics of  Anti-infectives (From admission, onward)   Start     Dose/Rate Route Frequency Ordered Stop   12/16/16 1700  ceFAZolin (ANCEF) IVPB 2g/100 mL premix     2 g 200 mL/hr over 30 Minutes Intravenous Every 6 hours 12/16/16 1450 12/16/16 2308   12/16/16 0902  ceFAZolin (ANCEF) IVPB 2g/100 mL premix     2 g 200 mL/hr over 30 Minutes Intravenous On call to O.R. 12/16/16 0902 12/16/16 1117     and started on DVT prophylaxis in the form of Xarelto.   PT and OT were ordered for total hip protocol.  The patient was allowed to be WBAT with therapy. Discharge planning was consulted to help with postop disposition and equipment needs.  Patient had a good night on the evening of surgery.  They started to get up OOB with therapy on day one.  Hemovac drain was pulled without difficulty.  Dressing looked good. Patient was seen in rounds on day one and was ready to go home after therapy goals.  Diet: Cardiac diet Activity:WBAT Follow-up:in  2 weeks Disposition - Home Discharged Condition: stable    Allergies as of 12/17/2016   No Known Allergies     Medication List    TAKE these medications   allopurinol 300 MG tablet Commonly known as:  ZYLOPRIM Take 300 mg by mouth daily.   clomiPHENE 50 MG tablet Commonly known as:  CLOMID TAKE 1/2 TABLET BY MOUTH DAILY   methocarbamol 500 MG tablet Commonly known as:  ROBAXIN Take 1 tablet (500 mg total) by mouth every 6 (six) hours as needed for muscle spasms.   metoprolol succinate 25 MG 24 hr tablet Commonly known as:  TOPROL-XL Take 1 tablet (25 mg total) by mouth daily.   oxyCODONE 5 MG immediate release  tablet Commonly known as:  Oxy IR/ROXICODONE Take 1-2 tablets (5-10 mg total) by mouth every 4 (four) hours as needed for moderate pain ((score 4 to 6)).   rosuvastatin 10 MG tablet Commonly known as:  CRESTOR TAKE 1 TABLET BY MOUTH DAILY   traMADol 50 MG tablet Commonly known as:  ULTRAM Take 1-2 tablets (50-100 mg total) by mouth every 6 (six) hours as needed for moderate pain.      Follow-up Information    Gaynelle Arabian, MD. Schedule an appointment as soon as possible for a visit on 12/29/2016.   Specialty:  Orthopedic Surgery Why:  Call [phone] Monday to make the appointment Contact information: [address] [place] [address] [phone]        [facility]   Specialty:  [place] Why:  [place] will call to arrange initial appointment Contact information: [place]  [id number] [phone]           Signed: Arlee Muslim, PA-C Orthopaedic Surgery 12/27/2016, 5:17 PM

## 2016-12-28 DIAGNOSIS — I10 Essential (primary) hypertension: Secondary | ICD-10-CM | POA: Diagnosis not present

## 2016-12-28 DIAGNOSIS — Z86718 Personal history of other venous thrombosis and embolism: Secondary | ICD-10-CM | POA: Diagnosis not present

## 2016-12-28 DIAGNOSIS — S83232D Complex tear of medial meniscus, current injury, left knee, subsequent encounter: Secondary | ICD-10-CM | POA: Diagnosis not present

## 2016-12-28 DIAGNOSIS — I2510 Atherosclerotic heart disease of native coronary artery without angina pectoris: Secondary | ICD-10-CM | POA: Diagnosis not present

## 2016-12-28 DIAGNOSIS — I252 Old myocardial infarction: Secondary | ICD-10-CM | POA: Diagnosis not present

## 2016-12-28 DIAGNOSIS — Z96641 Presence of right artificial hip joint: Secondary | ICD-10-CM | POA: Diagnosis not present

## 2016-12-28 DIAGNOSIS — Z79891 Long term (current) use of opiate analgesic: Secondary | ICD-10-CM | POA: Diagnosis not present

## 2016-12-28 DIAGNOSIS — G5623 Lesion of ulnar nerve, bilateral upper limbs: Secondary | ICD-10-CM | POA: Diagnosis not present

## 2016-12-28 DIAGNOSIS — Z471 Aftercare following joint replacement surgery: Secondary | ICD-10-CM | POA: Diagnosis not present

## 2016-12-28 DIAGNOSIS — Z86711 Personal history of pulmonary embolism: Secondary | ICD-10-CM | POA: Diagnosis not present

## 2016-12-28 DIAGNOSIS — M109 Gout, unspecified: Secondary | ICD-10-CM | POA: Diagnosis not present

## 2016-12-28 DIAGNOSIS — E785 Hyperlipidemia, unspecified: Secondary | ICD-10-CM | POA: Diagnosis not present

## 2016-12-28 DIAGNOSIS — Z7901 Long term (current) use of anticoagulants: Secondary | ICD-10-CM | POA: Diagnosis not present

## 2017-03-25 DIAGNOSIS — M25562 Pain in left knee: Secondary | ICD-10-CM | POA: Diagnosis not present

## 2017-03-25 DIAGNOSIS — M170 Bilateral primary osteoarthritis of knee: Secondary | ICD-10-CM | POA: Diagnosis not present

## 2017-05-06 DIAGNOSIS — M1712 Unilateral primary osteoarthritis, left knee: Secondary | ICD-10-CM | POA: Diagnosis not present

## 2017-05-12 LAB — HIV ANTIBODY (ROUTINE TESTING W REFLEX): HIV: NEGATIVE

## 2017-05-12 LAB — LIPID PANEL
CHOLESTEROL: 147 (ref 0–200)
HDL: 53 (ref 35–70)
LDL CALC: 70
TRIGLYCERIDES: 117 (ref 40–160)

## 2017-05-12 LAB — PSA: PSA: 0.83

## 2017-05-12 LAB — BASIC METABOLIC PANEL
BUN: 22 — AB (ref 4–21)
CREATININE: 1.1 (ref 0.6–1.3)
Glucose: 88

## 2017-05-12 LAB — HEPATIC FUNCTION PANEL
ALK PHOS: 72 (ref 25–125)
ALT: 17 (ref 10–40)
AST: 17 (ref 14–40)
Bilirubin, Total: 0.4

## 2017-05-12 LAB — HEMOGLOBIN A1C: Hemoglobin A1C: 5.6

## 2017-05-13 DIAGNOSIS — M1712 Unilateral primary osteoarthritis, left knee: Secondary | ICD-10-CM | POA: Diagnosis not present

## 2017-05-25 DIAGNOSIS — M1712 Unilateral primary osteoarthritis, left knee: Secondary | ICD-10-CM | POA: Diagnosis not present

## 2017-05-27 ENCOUNTER — Ambulatory Visit (INDEPENDENT_AMBULATORY_CARE_PROVIDER_SITE_OTHER): Payer: BLUE CROSS/BLUE SHIELD | Admitting: Internal Medicine

## 2017-05-27 ENCOUNTER — Other Ambulatory Visit (INDEPENDENT_AMBULATORY_CARE_PROVIDER_SITE_OTHER): Payer: BLUE CROSS/BLUE SHIELD

## 2017-05-27 ENCOUNTER — Encounter: Payer: Self-pay | Admitting: Internal Medicine

## 2017-05-27 VITALS — BP 130/80 | HR 80 | Temp 97.9°F | Ht 70.0 in | Wt 238.0 lb

## 2017-05-27 DIAGNOSIS — I10 Essential (primary) hypertension: Secondary | ICD-10-CM | POA: Diagnosis not present

## 2017-05-27 DIAGNOSIS — E785 Hyperlipidemia, unspecified: Secondary | ICD-10-CM

## 2017-05-27 DIAGNOSIS — R739 Hyperglycemia, unspecified: Secondary | ICD-10-CM | POA: Diagnosis not present

## 2017-05-27 DIAGNOSIS — Z0000 Encounter for general adult medical examination without abnormal findings: Secondary | ICD-10-CM

## 2017-05-27 DIAGNOSIS — E291 Testicular hypofunction: Secondary | ICD-10-CM | POA: Diagnosis not present

## 2017-05-27 DIAGNOSIS — D6859 Other primary thrombophilia: Secondary | ICD-10-CM | POA: Diagnosis not present

## 2017-05-27 DIAGNOSIS — M1000 Idiopathic gout, unspecified site: Secondary | ICD-10-CM | POA: Diagnosis not present

## 2017-05-27 DIAGNOSIS — I2510 Atherosclerotic heart disease of native coronary artery without angina pectoris: Secondary | ICD-10-CM

## 2017-05-27 LAB — URINALYSIS, ROUTINE W REFLEX MICROSCOPIC
Bilirubin Urine: NEGATIVE
Hgb urine dipstick: NEGATIVE
Leukocytes, UA: NEGATIVE
Nitrite: NEGATIVE
PH: 5.5 (ref 5.0–8.0)
RBC / HPF: NONE SEEN (ref 0–?)
Specific Gravity, Urine: 1.02 (ref 1.000–1.030)
Total Protein, Urine: NEGATIVE
UROBILINOGEN UA: 0.2 (ref 0.0–1.0)
Urine Glucose: NEGATIVE

## 2017-05-27 LAB — CBC WITH DIFFERENTIAL/PLATELET
BASOS PCT: 0.5 % (ref 0.0–3.0)
Basophils Absolute: 0 10*3/uL (ref 0.0–0.1)
EOS PCT: 2.4 % (ref 0.0–5.0)
Eosinophils Absolute: 0.2 10*3/uL (ref 0.0–0.7)
HEMATOCRIT: 42.7 % (ref 39.0–52.0)
HEMOGLOBIN: 14.6 g/dL (ref 13.0–17.0)
Lymphocytes Relative: 32.4 % (ref 12.0–46.0)
Lymphs Abs: 2.4 10*3/uL (ref 0.7–4.0)
MCHC: 34.3 g/dL (ref 30.0–36.0)
MCV: 97.3 fl (ref 78.0–100.0)
MONO ABS: 0.8 10*3/uL (ref 0.1–1.0)
MONOS PCT: 10.2 % (ref 3.0–12.0)
Neutro Abs: 4.1 10*3/uL (ref 1.4–7.7)
Neutrophils Relative %: 54.5 % (ref 43.0–77.0)
Platelets: 172 10*3/uL (ref 150.0–400.0)
RBC: 4.39 Mil/uL (ref 4.22–5.81)
RDW: 14.3 % (ref 11.5–15.5)
WBC: 7.5 10*3/uL (ref 4.0–10.5)

## 2017-05-27 LAB — TSH: TSH: 2.78 u[IU]/mL (ref 0.35–4.50)

## 2017-05-27 MED ORDER — CLOMIPHENE CITRATE 50 MG PO TABS: 50.0000 mg | ORAL_TABLET | Freq: Every day | ORAL | 1 refills | Status: DC

## 2017-05-27 NOTE — Patient Instructions (Signed)

## 2017-05-27 NOTE — Progress Notes (Signed)
Subjective:  Patient ID: Roy Yang, male    DOB: 12-31-1955  Age: 62 y.o. MRN: [id number]  CC: Coronary Artery Disease; Hypertension; and Annual Exam   HPI EDMOND GINSBERG presents for a CPX.  He underwent orthopedic surgery about 6 months ago and suffered from postop anemia but did not have a thromboembolic event.  He has a history of hypercoagulability and is maintained on Xarelto.  He denies any recent episodes of CP, S OB, DOE, palpitations, edema, or fatigue.  He does complain of low libido and wants to increase his dose of clomiphene.  He had a health maintenance labs done elsewhere a few weeks ago and those have been abstracted into his chart.  Outpatient Medications Prior to Visit  Medication Sig Dispense Refill  . allopurinol (ZYLOPRIM) 300 MG tablet Take 300 mg by mouth daily.    . metoprolol succinate (TOPROL-XL) 25 MG 24 hr tablet Take 1 tablet (25 mg total) by mouth daily. 90 tablet 3  . rosuvastatin (CRESTOR) 10 MG tablet TAKE 1 TABLET BY MOUTH DAILY 90 tablet 1  . XARELTO 20 MG TABS tablet TAKE 1 TABLET BY MOUTH DAILY WITH SUPPER 90 tablet 1  . clomiPHENE (CLOMID) 50 MG tablet TAKE 1/2 TABLET BY MOUTH DAILY 45 tablet 1  . methocarbamol (ROBAXIN) 500 MG tablet Take 1 tablet (500 mg total) by mouth every 6 (six) hours as needed for muscle spasms. 60 tablet 0  . oxyCODONE (OXY IR/ROXICODONE) 5 MG immediate release tablet Take 1-2 tablets (5-10 mg total) by mouth every 4 (four) hours as needed for moderate pain ((score 4 to 6)). 60 tablet 0  . traMADol (ULTRAM) 50 MG tablet Take 1-2 tablets (50-100 mg total) by mouth every 6 (six) hours as needed for moderate pain. 60 tablet 0   No facility-administered medications prior to visit.     ROS Review of Systems  Constitutional: Negative.  Negative for diaphoresis, fatigue and unexpected weight change.  HENT: Negative.   Eyes: Negative for visual disturbance.  Respiratory: Negative for cough, chest tightness, shortness of  breath and wheezing.   Cardiovascular: Negative for chest pain, palpitations and leg swelling.  Gastrointestinal: Negative for abdominal pain, blood in stool, constipation, diarrhea and vomiting.  Endocrine: Negative.   Genitourinary: Negative.  Negative for difficulty urinating.  Musculoskeletal: Positive for arthralgias. Negative for myalgias.  Skin: Negative.  Negative for color change.  Allergic/Immunologic: Negative.   Neurological: Negative.  Negative for dizziness, weakness, light-headedness and headaches.  Hematological: Negative for adenopathy. Does not bruise/bleed easily.  Psychiatric/Behavioral: Negative.     Objective:  BP 130/80 (BP Location: Left Arm, Patient Position: Sitting, Cuff Size: Large)   Pulse 80   Temp 97.9 F (36.6 C) (Oral)   Ht  (1.778 m)   Wt 238 lb (108 kg)   SpO2 96%   BMI 34.15 kg/m   BP Readings from Last 3 Encounters:  05/27/17 130/80  12/17/16 123/76  12/10/16 127/76    Wt Readings from Last 3 Encounters:  05/27/17 238 lb (108 kg)  12/16/16 240 lb (108.9 kg)  12/10/16 240 lb 2 oz (108.9 kg)    Physical Exam  Constitutional: He is oriented to person, place, and time. No distress.  HENT:  Mouth/Throat: Oropharynx is clear and moist. No oropharyngeal exudate.  Eyes: Conjunctivae are normal. No scleral icterus.  Neck: Normal range of motion. Neck supple. No JVD present.  Cardiovascular: Normal rate, regular rhythm and normal heart sounds. Exam reveals no gallop  and no friction rub.  No murmur heard. Pulmonary/Chest: Effort normal and breath sounds normal. No stridor. No respiratory distress. He has no wheezes. He has no rales.  Abdominal: Soft. Bowel sounds are normal. There is no hepatosplenomegaly. There is no tenderness. Hernia confirmed negative in the right inguinal area and confirmed negative in the left inguinal area.  Genitourinary: Testes normal and penis normal. Right testis shows no mass, no swelling and no tenderness.  Left testis shows no mass, no swelling and no tenderness. Circumcised. No penile erythema or penile tenderness. No discharge found.  Musculoskeletal: Normal range of motion. He exhibits no edema, tenderness or deformity.  Lymphadenopathy:    He has no cervical adenopathy. No inguinal adenopathy noted on the right or left side.  Neurological: He is alert and oriented to person, place, and time.  Skin: Skin is warm and dry. He is not diaphoretic.  Psychiatric: He has a normal mood and affect. His behavior is normal. Judgment and thought content normal.  Vitals reviewed.   Lab Results  Component Value Date   WBC 7.5 05/27/2017   HGB 14.6 05/27/2017   HCT 42.7 05/27/2017   PLT 172.0 05/27/2017   GLUCOSE 124 (H) 05/28/2017   CHOL 147 05/12/2017   TRIG 117 05/12/2017   HDL 53 05/12/2017   LDLCALC 70 05/12/2017   ALT 17 05/12/2017   AST 17 05/12/2017   NA 139 05/28/2017   K 3.8 05/28/2017   CL 106 05/28/2017   CREATININE 1.23 05/28/2017   BUN 23 05/28/2017   CO2 24 05/28/2017   TSH 2.78 05/27/2017   PSA 0.83 05/12/2017   INR 1.07 12/16/2016   HGBA1C 5.6 05/12/2017    No results found.  Assessment & Plan:   [place] was seen today for coronary artery disease, hypertension and annual exam.  Diagnoses and all orders for this visit:  Essential hypertension- His blood pressure is adequately well controlled. -     Cancel: Comprehensive metabolic panel; Future -     CBC with Differential/Platelet; Future -     TSH; Future -     Urinalysis, Routine w reflex microscopic; Future -     Basic metabolic panel; Future  Dyslipidemia, goal LDL below 100- He has achieved his LDL goal and is doing well on the statin. -     TSH; Future  Coronary artery disease involving native coronary artery of native heart without angina pectoris- He is free of angina.  Will continue risk factor modifications.  Routine general medical examination at a health care facility- Exam completed, he refuses flu  vaccines, labs ordered and reviewed, screening for colon cancer is up-to-date, patient education material was given. -     Cancel: Lipid panel; Future -     Cancel: PSA; Future  Idiopathic gout, unspecified chronicity, unspecified site  Hyperglycemia- Improvement noted.  His recent A1c was down to 5.6. -     Cancel: Hemoglobin A1c; Future  Hypogonadism male- His testosterone level is normal.  Lab work is negative for any complications related to this.  Will increase the dose of clomiphene at his request. -     clomiPHENE (CLOMID) 50 MG tablet; Take 1 tablet (50 mg total) by mouth daily. -     Testosterone Total,Free,Bio, Males; Future  Hypercoagulable state, primary (HCC)- H has had no recent thromboembolic events.  Will continue Xarelto at the current dose.  Other orders -     Cancel: D-dimer, quantitative (not at [facility]); Future   I have  discontinued Tinnie Gens A. Callender's methocarbamol, oxyCODONE, and traMADol. I have also changed his clomiPHENE. Additionally, I am having him maintain his allopurinol, metoprolol succinate, rosuvastatin, and XARELTO.  Meds ordered this encounter  Medications  . clomiPHENE (CLOMID) 50 MG tablet    Sig: Take 1 tablet (50 mg total) by mouth daily.    Dispense:  90 tablet    Refill:  1     Follow-up: Return in about 6 months (around 11/27/2017).  Sanda Linger, MD

## 2017-05-28 ENCOUNTER — Other Ambulatory Visit (INDEPENDENT_AMBULATORY_CARE_PROVIDER_SITE_OTHER): Payer: BLUE CROSS/BLUE SHIELD

## 2017-05-28 DIAGNOSIS — I10 Essential (primary) hypertension: Secondary | ICD-10-CM | POA: Diagnosis not present

## 2017-05-28 LAB — BASIC METABOLIC PANEL
BUN: 23 mg/dL (ref 6–23)
CHLORIDE: 106 meq/L (ref 96–112)
CO2: 24 mEq/L (ref 19–32)
Calcium: 9.3 mg/dL (ref 8.4–10.5)
Creatinine, Ser: 1.23 mg/dL (ref 0.40–1.50)
GFR: 63.08 mL/min (ref >60.00)
Glucose, Bld: 124 mg/dL — ABNORMAL HIGH (ref 70–99)
POTASSIUM: 3.8 meq/L (ref 3.5–5.1)
SODIUM: 139 meq/L (ref 135–145)

## 2017-05-28 LAB — TESTOSTERONE TOTAL,FREE,BIO, MALES
Albumin: 4.1 g/dL (ref 3.6–5.1)
Sex Hormone Binding: 40 nmol/L (ref 22–77)
TESTOSTERONE FREE: 68 pg/mL (ref 46.0–224.0)
Testosterone, Bioavailable: 128 ng/dL (ref >=110.0)
Testosterone: 568 ng/dL (ref 250–827)

## 2017-05-30 ENCOUNTER — Encounter: Payer: Self-pay | Admitting: Internal Medicine

## 2017-06-15 ENCOUNTER — Other Ambulatory Visit: Payer: Self-pay | Admitting: Internal Medicine

## 2017-06-15 DIAGNOSIS — E7800 Pure hypercholesterolemia, unspecified: Secondary | ICD-10-CM

## 2017-06-15 DIAGNOSIS — I10 Essential (primary) hypertension: Secondary | ICD-10-CM

## 2017-06-16 ENCOUNTER — Other Ambulatory Visit: Payer: Self-pay | Admitting: Internal Medicine

## 2017-06-16 DIAGNOSIS — E291 Testicular hypofunction: Secondary | ICD-10-CM

## 2017-07-01 ENCOUNTER — Other Ambulatory Visit: Payer: Self-pay | Admitting: Internal Medicine

## 2017-07-01 DIAGNOSIS — I2699 Other pulmonary embolism without acute cor pulmonale: Secondary | ICD-10-CM

## 2017-07-01 DIAGNOSIS — Z86718 Personal history of other venous thrombosis and embolism: Secondary | ICD-10-CM

## 2017-12-06 ENCOUNTER — Other Ambulatory Visit: Payer: Self-pay | Admitting: Internal Medicine

## 2017-12-06 DIAGNOSIS — E7800 Pure hypercholesterolemia, unspecified: Secondary | ICD-10-CM

## 2017-12-06 DIAGNOSIS — E291 Testicular hypofunction: Secondary | ICD-10-CM

## 2017-12-06 DIAGNOSIS — I10 Essential (primary) hypertension: Secondary | ICD-10-CM

## 2017-12-06 DIAGNOSIS — Z86718 Personal history of other venous thrombosis and embolism: Secondary | ICD-10-CM

## 2017-12-06 DIAGNOSIS — I2699 Other pulmonary embolism without acute cor pulmonale: Secondary | ICD-10-CM

## 2018-01-06 ENCOUNTER — Ambulatory Visit: Payer: BLUE CROSS/BLUE SHIELD | Admitting: Internal Medicine

## 2018-01-06 ENCOUNTER — Encounter: Payer: Self-pay | Admitting: Internal Medicine

## 2018-01-06 VITALS — BP 112/70 | HR 63 | Temp 98.0°F | Resp 16 | Ht 70.0 in | Wt 238.0 lb

## 2018-01-06 DIAGNOSIS — I10 Essential (primary) hypertension: Secondary | ICD-10-CM | POA: Diagnosis not present

## 2018-01-06 DIAGNOSIS — M2391 Unspecified internal derangement of right knee: Secondary | ICD-10-CM | POA: Diagnosis not present

## 2018-01-06 DIAGNOSIS — E291 Testicular hypofunction: Secondary | ICD-10-CM

## 2018-01-06 DIAGNOSIS — R739 Hyperglycemia, unspecified: Secondary | ICD-10-CM

## 2018-01-06 MED ORDER — CLOMIPHENE CITRATE 50 MG PO TABS: 50.0000 mg | ORAL_TABLET | Freq: Every day | ORAL | 0 refills | Status: DC

## 2018-01-06 NOTE — Progress Notes (Signed)
Subjective:  Patient ID: Roy Yang, male    DOB: 05/16/1955  Age: 62 y.o. MRN: [id number]  CC: Knee Pain   HPI Roy Yang presents for a 6-week history of intermittent pain and swelling in his right knee.  The symptoms started while he was walking up an incline and he abruptly felt the knee become painful, swollen, and unstable.  Outpatient Medications Prior to Visit  Medication Sig Dispense Refill  . allopurinol (ZYLOPRIM) 300 MG tablet Take 300 mg by mouth daily.    . metoprolol succinate (TOPROL-XL) 25 MG 24 hr tablet TAKE 1 TABLET BY MOUTH EVERY DAY 90 tablet 0  . rosuvastatin (CRESTOR) 10 MG tablet Take 10 mg by mouth daily.    Carlena Hurl. XARELTO 20 MG TABS tablet TAKE 1 TABLET(20 MG) BY MOUTH DAILY WITH SUPPER 90 tablet 0  . clomiPHENE (CLOMID) 50 MG tablet TAKE 1 TABLET(50 MG) BY MOUTH DAILY 90 tablet 0  . clomiPHENE (CLOMID) 50 MG tablet TAKE 1/2 TABLET BY MOUTH EVERY DAY 90 tablet 0  . rosuvastatin (CRESTOR) 10 MG tablet TAKE 1 TABLET BY MOUTH DAILY 90 tablet 1  . rosuvastatin (CRESTOR) 10 MG tablet TAKE 1 TABLET BY MOUTH EVERY DAY 90 tablet 0   No facility-administered medications prior to visit.     ROS Review of Systems  Constitutional: Negative for chills, fatigue and fever.  HENT: Negative.   Eyes: Negative for visual disturbance.  Respiratory: Negative for cough, chest tightness and shortness of breath.   Cardiovascular: Negative for chest pain, palpitations and leg swelling.  Gastrointestinal: Negative for abdominal pain, constipation, diarrhea and nausea.  Genitourinary: Negative.  Negative for difficulty urinating and dysuria.  Musculoskeletal: Positive for arthralgias.  Skin: Negative.  Negative for pallor and rash.  Neurological: Negative.  Negative for dizziness.  Hematological: Negative for adenopathy. Does not bruise/bleed easily.  Psychiatric/Behavioral: Negative.     Objective:  BP 112/70 (BP Location: Left Arm, Patient Position: Sitting, Cuff  Size: Normal)   Pulse 63   Temp 98 F (36.7 C) (Oral)   Resp 16   Ht 5\' 10"  (1.778 m)   Wt 238 lb (108 kg)   SpO2 95%   BMI 34.15 kg/m   BP Readings from Last 3 Encounters:  01/06/18 112/70  05/27/17 130/80  12/17/16 123/76    Wt Readings from Last 3 Encounters:  01/06/18 238 lb (108 kg)  05/27/17 238 lb (108 kg)  12/16/16 240 lb (108.9 kg)    Physical Exam Vitals signs reviewed.  Constitutional:      Appearance: Normal appearance.  HENT:     Nose: Nose normal.     Mouth/Throat:     Mouth: Mucous membranes are dry.  Eyes:     Conjunctiva/sclera: Conjunctivae normal.  Neck:     Musculoskeletal: Normal range of motion and neck supple.  Cardiovascular:     Rate and Rhythm: Normal rate and regular rhythm.     Pulses: Normal pulses.     Heart sounds: Normal heart sounds. No murmur. No gallop.   Pulmonary:     Effort: Pulmonary effort is normal.     Breath sounds: Normal breath sounds. No wheezing, rhonchi or rales.  Abdominal:     General: Abdomen is flat. Bowel sounds are normal.     Palpations: Abdomen is soft. There is no splenomegaly or mass.     Tenderness: There is no abdominal tenderness.  Musculoskeletal: Normal range of motion.        General:  No swelling.     Right knee: He exhibits abnormal meniscus and MCL laxity. He exhibits normal range of motion, no swelling, no effusion, no ecchymosis, no deformity, no laceration, no erythema, normal alignment, no LCL laxity and no bony tenderness. Tenderness found. Medial joint line tenderness noted. No lateral joint line tenderness noted.  Skin:    General: Skin is warm and dry.  Neurological:     General: No focal deficit present.     Mental Status: He is alert and oriented to person, place, and time.     Lab Results  Component Value Date   WBC 7.5 05/27/2017   HGB 14.6 05/27/2017   HCT 42.7 05/27/2017   PLT 172.0 05/27/2017   GLUCOSE 124 (H) 05/28/2017   CHOL 147 05/12/2017   TRIG 117 05/12/2017   HDL  53 05/12/2017   LDLCALC 70 05/12/2017   ALT 17 05/12/2017   AST 17 05/12/2017   NA 139 05/28/2017   K 3.8 05/28/2017   CL 106 05/28/2017   CREATININE 1.23 05/28/2017   BUN 23 05/28/2017   CO2 24 05/28/2017   TSH 2.78 05/27/2017   PSA 0.83 05/12/2017   INR 1.07 12/16/2016   HGBA1C 5.6 05/12/2017    No results found.  Assessment & Plan:   Roy Yang was seen today for knee pain.  Diagnoses and all orders for this visit:  Hyperglycemia  Essential hypertension- His blood pressure is well controlled.  Hypogonadism male -     clomiPHENE (CLOMID) 50 MG tablet; Take 1 tablet (50 mg total) by mouth daily.  Internal derangement of knee joint, right- His symptoms and exam are concerning with a medial meniscus tear. I have asked him to have an MRI done to see if this is the case and to consider surgical intervention.  Will also screen for degenerative changes that may require surgery.  He cannot take an NSAID because he is anticoagulated with Xarelto.  He will take Tylenol as needed for the discomfort. -     MR Knee Right Wo Contrast; Future   I have discontinued Roy Yang's clomiPHENE. I have also changed his clomiPHENE. Additionally, I am having him maintain his allopurinol, XARELTO, metoprolol succinate, and rosuvastatin.  Meds ordered this encounter  Medications  . clomiPHENE (CLOMID) 50 MG tablet    Sig: Take 1 tablet (50 mg total) by mouth daily.    Dispense:  90 tablet    Refill:  0     Follow-up: Return if symptoms worsen or fail to improve.  Sanda Linger, MD

## 2018-01-06 NOTE — Patient Instructions (Signed)
Meniscus Tear A meniscus tear is a knee injury in which a piece of the meniscus is torn. The meniscus is a thick, rubbery, wedge-shaped cartilage in the knee. Two menisci are located in each knee. They sit between the upper bone (femur) and lower bone (tibia) that make up the knee joint. Each meniscus acts as a shock absorber for the knee. A torn meniscus is one of the most common types of knee injuries. This injury can range from mild to severe. Surgery may be needed for a severe tear. What are the causes? This injury may be caused by any squatting, twisting, or pivoting movement. Sports-related injuries are the most common cause. These often occur from:  Running and stopping suddenly.  Changing direction.  Being tackled or knocked off your feet.  As people get older, their meniscus gets thinner and weaker. In these people, tears can happen more easily, such as from climbing stairs. What increases the risk? This injury is more likely to happen to:  People who play contact sports.  Males.  People who are 30?64 years of age.  What are the signs or symptoms? Symptoms of this injury include:  Knee pain, especially at the side of the knee joint. You may feel pain when the injury occurs, or you may only hear a pop and feel pain later.  A feeling that your knee is clicking, catching, locking, or giving way.  Not being able to fully bend or extend your knee.  Bruising or swelling in your knee.  How is this diagnosed? This injury may be diagnosed based on your symptoms and a physical exam. The physical exam may include:  Moving your knee in different ways.  Feeling for tenderness.  Listening for a clicking sound.  Checking if your knee locks or catches.  You may also have tests, such as:  X-rays.  MRI.  A procedure to look inside your knee with a narrow surgical telescope (arthroscopy).  You may be referred to a knee specialist (orthopedic surgeon). How is this  treated? Treatment for this injury depends on the severity of the tear. Treatment for a mild tear may include:  Rest.  Medicine to reduce pain and swelling. This is usually a nonsteroidal anti-inflammatory drug (NSAID).  A knee brace or an elastic sleeve or wrap.  Using crutches or a walker to keep weight off your knee and to help you walk.  Exercises to strengthen your knee (physical therapy).  You may need surgery if you have a severe tear or if other treatments are not working. Follow these instructions at home: Managing pain and swelling  Take over-the-counter and prescription medicines only as told by your health care provider.  If directed, apply ice to the injured area: ? Put ice in a plastic bag. ? Place a towel between your skin and the bag. ? Leave the ice on for 20 minutes, 2-3 times per day.  Raise (elevate) the injured area above the level of your heart while you are sitting or lying down. Activity  Do not use the injured limb to support your body weight until your health care provider says that you can. Use crutches or a walker as told by your health care provider.  Return to your normal activities as told by your health care provider. Ask your health care provider what activities are safe for you.  Perform range-of-motion exercises only as told by your health care provider.  Begin doing exercises to strengthen your knee and leg muscles only   as told by your health care provider. After you recover, your health care provider may recommend these exercises to help prevent another injury. General instructions  Use a knee brace or elastic wrap as told by your health care provider.  Keep all follow-up visits as told by your health care provider. This is important. Contact a health care provider if:  You have a fever.  Your knee becomes red, tender, or swollen.  Your pain medicine is not helping.  Your symptoms get worse or do not improve after 2 weeks of home  care. This information is not intended to replace advice given to you by your health care provider. Make sure you discuss any questions you have with your health care provider. Document Released: 04/04/2002 Document Revised: 06/20/2015 Document Reviewed: 05/07/2014 Elsevier Interactive Patient Education  2018 [employer]  

## 2018-01-21 DIAGNOSIS — M1711 Unilateral primary osteoarthritis, right knee: Secondary | ICD-10-CM | POA: Diagnosis not present

## 2018-01-27 DIAGNOSIS — B349 Viral infection, unspecified: Secondary | ICD-10-CM | POA: Diagnosis not present

## 2018-03-04 DIAGNOSIS — M1711 Unilateral primary osteoarthritis, right knee: Secondary | ICD-10-CM | POA: Diagnosis not present

## 2018-03-08 ENCOUNTER — Other Ambulatory Visit: Payer: Self-pay | Admitting: Internal Medicine

## 2018-03-08 DIAGNOSIS — I2699 Other pulmonary embolism without acute cor pulmonale: Secondary | ICD-10-CM

## 2018-03-08 DIAGNOSIS — Z86718 Personal history of other venous thrombosis and embolism: Secondary | ICD-10-CM

## 2018-03-08 DIAGNOSIS — I10 Essential (primary) hypertension: Secondary | ICD-10-CM

## 2018-03-29 DIAGNOSIS — M25462 Effusion, left knee: Secondary | ICD-10-CM | POA: Diagnosis not present

## 2018-03-29 DIAGNOSIS — M25562 Pain in left knee: Secondary | ICD-10-CM | POA: Diagnosis not present

## 2018-03-29 DIAGNOSIS — M1712 Unilateral primary osteoarthritis, left knee: Secondary | ICD-10-CM | POA: Diagnosis not present

## 2018-04-06 ENCOUNTER — Other Ambulatory Visit: Payer: Self-pay | Admitting: Internal Medicine

## 2018-04-06 DIAGNOSIS — E291 Testicular hypofunction: Secondary | ICD-10-CM

## 2018-06-17 DIAGNOSIS — Z471 Aftercare following joint replacement surgery: Secondary | ICD-10-CM | POA: Diagnosis not present

## 2018-06-17 DIAGNOSIS — M25562 Pain in left knee: Secondary | ICD-10-CM | POA: Diagnosis not present

## 2018-06-17 DIAGNOSIS — Z96641 Presence of right artificial hip joint: Secondary | ICD-10-CM | POA: Diagnosis not present

## 2018-06-22 ENCOUNTER — Other Ambulatory Visit: Payer: Self-pay

## 2018-06-22 ENCOUNTER — Other Ambulatory Visit (INDEPENDENT_AMBULATORY_CARE_PROVIDER_SITE_OTHER): Payer: BLUE CROSS/BLUE SHIELD

## 2018-06-22 ENCOUNTER — Encounter: Payer: Self-pay | Admitting: Internal Medicine

## 2018-06-22 ENCOUNTER — Ambulatory Visit (INDEPENDENT_AMBULATORY_CARE_PROVIDER_SITE_OTHER): Payer: BLUE CROSS/BLUE SHIELD | Admitting: Internal Medicine

## 2018-06-22 VITALS — BP 120/80 | HR 64 | Temp 98.0°F | Resp 16 | Ht 70.0 in | Wt 238.0 lb

## 2018-06-22 DIAGNOSIS — Z0000 Encounter for general adult medical examination without abnormal findings: Secondary | ICD-10-CM | POA: Diagnosis not present

## 2018-06-22 DIAGNOSIS — D6859 Other primary thrombophilia: Secondary | ICD-10-CM | POA: Diagnosis not present

## 2018-06-22 DIAGNOSIS — I2699 Other pulmonary embolism without acute cor pulmonale: Secondary | ICD-10-CM

## 2018-06-22 DIAGNOSIS — I10 Essential (primary) hypertension: Secondary | ICD-10-CM

## 2018-06-22 DIAGNOSIS — N400 Enlarged prostate without lower urinary tract symptoms: Secondary | ICD-10-CM | POA: Diagnosis not present

## 2018-06-22 DIAGNOSIS — E291 Testicular hypofunction: Secondary | ICD-10-CM | POA: Diagnosis not present

## 2018-06-22 DIAGNOSIS — R739 Hyperglycemia, unspecified: Secondary | ICD-10-CM

## 2018-06-22 DIAGNOSIS — E785 Hyperlipidemia, unspecified: Secondary | ICD-10-CM

## 2018-06-22 DIAGNOSIS — R195 Other fecal abnormalities: Secondary | ICD-10-CM | POA: Insufficient documentation

## 2018-06-22 DIAGNOSIS — Z86718 Personal history of other venous thrombosis and embolism: Secondary | ICD-10-CM

## 2018-06-22 LAB — LIPID PANEL
Cholesterol: 122 mg/dL (ref 0–200)
HDL: 41.7 mg/dL (ref >39.00)
LDL Cholesterol: 59 mg/dL (ref 0–99)
NonHDL: 80.57
Total CHOL/HDL Ratio: 3
Triglycerides: 108 mg/dL (ref 0.0–149.0)
VLDL: 21.6 mg/dL (ref 0.0–40.0)

## 2018-06-22 LAB — COMPREHENSIVE METABOLIC PANEL
ALT: 18 U/L (ref 0–53)
AST: 19 U/L (ref 0–37)
Albumin: 3.9 g/dL (ref 3.5–5.2)
Alkaline Phosphatase: 52 U/L (ref 39–117)
BUN: 21 mg/dL (ref 6–23)
CO2: 26 mEq/L (ref 19–32)
Calcium: 8.7 mg/dL (ref 8.4–10.5)
Chloride: 106 mEq/L (ref 96–112)
Creatinine, Ser: 1.14 mg/dL (ref 0.40–1.50)
GFR: 64.57 mL/min (ref >60.00)
Glucose, Bld: 99 mg/dL (ref 70–99)
Potassium: 3.9 mEq/L (ref 3.5–5.1)
Sodium: 138 mEq/L (ref 135–145)
Total Bilirubin: 0.5 mg/dL (ref 0.2–1.2)
Total Protein: 6.1 g/dL (ref 6.0–8.3)

## 2018-06-22 LAB — URINALYSIS, ROUTINE W REFLEX MICROSCOPIC
Bilirubin Urine: NEGATIVE
Hgb urine dipstick: NEGATIVE
Ketones, ur: NEGATIVE
Leukocytes,Ua: NEGATIVE
Nitrite: NEGATIVE
RBC / HPF: NONE SEEN (ref 0–?)
Specific Gravity, Urine: 1.025 (ref 1.000–1.030)
Total Protein, Urine: NEGATIVE
Urine Glucose: NEGATIVE
Urobilinogen, UA: 0.2 (ref 0.0–1.0)
pH: 5 (ref 5.0–8.0)

## 2018-06-22 LAB — CBC WITH DIFFERENTIAL/PLATELET
Basophils Absolute: 0 10*3/uL (ref 0.0–0.1)
Basophils Relative: 0.6 % (ref 0.0–3.0)
Eosinophils Absolute: 0.3 10*3/uL (ref 0.0–0.7)
Eosinophils Relative: 4.4 % (ref 0.0–5.0)
HCT: 42.5 % (ref 39.0–52.0)
Hemoglobin: 14.9 g/dL (ref 13.0–17.0)
Lymphocytes Relative: 43.1 % (ref 12.0–46.0)
Lymphs Abs: 3.1 10*3/uL (ref 0.7–4.0)
MCHC: 35 g/dL (ref 30.0–36.0)
MCV: 98.5 fl (ref 78.0–100.0)
Monocytes Absolute: 1 10*3/uL (ref 0.1–1.0)
Monocytes Relative: 13.8 % — ABNORMAL HIGH (ref 3.0–12.0)
Neutro Abs: 2.7 10*3/uL (ref 1.4–7.7)
Neutrophils Relative %: 38.1 % — ABNORMAL LOW (ref 43.0–77.0)
Platelets: 165 10*3/uL (ref 150.0–400.0)
RBC: 4.32 Mil/uL (ref 4.22–5.81)
RDW: 13.5 % (ref 11.5–15.5)
WBC: 7.2 10*3/uL (ref 4.0–10.5)

## 2018-06-22 LAB — TSH: TSH: 3.44 u[IU]/mL (ref 0.35–4.50)

## 2018-06-22 LAB — PSA: PSA: 0.77 ng/mL (ref 0.10–4.00)

## 2018-06-22 LAB — HEMOGLOBIN A1C: Hgb A1c MFr Bld: 5.9 % (ref 4.6–6.5)

## 2018-06-22 NOTE — Patient Instructions (Signed)

## 2018-06-22 NOTE — Progress Notes (Signed)
Subjective:  Patient ID: Roy Yang, male    DOB: 07/05/1955  Age: [age] y.o. MRN: [id number]  CC: Annual Exam; Hypertension; Hyperlipidemia; and Coronary Artery Disease   HPI Roy SarinJeffrey A Gaspar presents for a CPX.  He is very active and denies any recent episodes of CP, DOE, palpitations, edema, or fatigue.  He has arthralgias in his hips and knees and has been seeing orthopedics recently.  Outpatient Medications Prior to Visit  Medication Sig Dispense Refill  . allopurinol (ZYLOPRIM) 300 MG tablet Take 300 mg by mouth daily.    . clomiPHENE (CLOMID) 50 MG tablet TAKE 1 TABLET BY MOUTH EVERY DAY 90 tablet 0  . metoprolol succinate (TOPROL-XL) 25 MG 24 hr tablet TAKE 1 TABLET BY MOUTH EVERY DAY 90 tablet 0  . rosuvastatin (CRESTOR) 10 MG tablet TAKE 1 TABLET BY MOUTH EVERY DAY 90 tablet 0  . XARELTO 20 MG TABS tablet TAKE 1 TABLET(20 MG) BY MOUTH DAILY WITH SUPPER 90 tablet 0   No facility-administered medications prior to visit.     ROS Review of Systems  Constitutional: Negative for diaphoresis, fatigue and unexpected weight change.  HENT: Negative.   Eyes: Negative for visual disturbance.  Respiratory: Negative for cough, chest tightness and shortness of breath.   Cardiovascular: Negative for chest pain, palpitations and leg swelling.  Gastrointestinal: Negative for abdominal pain, constipation, diarrhea, nausea and vomiting.  Genitourinary: Negative.  Negative for difficulty urinating, hematuria, scrotal swelling and testicular pain.  Musculoskeletal: Positive for arthralgias. Negative for back pain, myalgias and neck pain.  Skin: Negative.  Negative for color change and pallor.  Neurological: Negative.  Negative for dizziness and weakness.  Hematological: Negative for adenopathy. Does not bruise/bleed easily.  Psychiatric/Behavioral: Negative.     Objective:  BP 120/80 (BP Location: Left Arm, Patient Position: Sitting, Cuff Size: Large)   Pulse 64   Temp 98 F (36.7 C)  (Oral)   Resp 16   Ht 5\' 10"  (1.778 m)   Wt 238 lb (108 kg)   SpO2 98%   BMI 34.15 kg/m   BP Readings from Last 3 Encounters:  06/22/18 120/80  01/06/18 112/70  05/27/17 130/80    Wt Readings from Last 3 Encounters:  06/22/18 238 lb (108 kg)  01/06/18 238 lb (108 kg)  05/27/17 238 lb (108 kg)    Physical Exam Vitals signs reviewed.  Constitutional:      Appearance: He is not ill-appearing or diaphoretic.  HENT:     Nose: Nose normal.     Mouth/Throat:     Mouth: Mucous membranes are moist.  Eyes:     General: No scleral icterus.    Conjunctiva/sclera: Conjunctivae normal.  Neck:     Musculoskeletal: Normal range of motion. No neck rigidity or muscular tenderness.  Cardiovascular:     Rate and Rhythm: Normal rate and regular rhythm.     Heart sounds: No murmur. No gallop.   Pulmonary:     Effort: Pulmonary effort is normal.     Breath sounds: No stridor. No wheezing, rhonchi or rales.  Abdominal:     General: Abdomen is flat.     Palpations: There is no hepatomegaly, splenomegaly or mass.     Tenderness: There is no abdominal tenderness. There is no guarding.     Hernia: There is no hernia in the right inguinal area or left inguinal area.  Genitourinary:    Penis: Normal and circumcised. No discharge, swelling or lesions.  Scrotum/Testes: Normal.        Right: Mass or tenderness not present.        Left: Mass or tenderness not present.     Epididymis:     Right: Normal. Not inflamed or enlarged.     Left: Normal. Not inflamed or enlarged.     Prostate: Enlarged (1+ smooth symm BPH). Not tender and no nodules present.     Rectum: Guaiac result positive. Internal hemorrhoid present. No mass, tenderness, anal fissure or external hemorrhoid. Normal anal tone.  Musculoskeletal: Normal range of motion.     Right lower leg: No edema.     Left lower leg: No edema.  Lymphadenopathy:     Cervical: No cervical adenopathy.     Lower Body: No right inguinal  adenopathy. No left inguinal adenopathy.  Skin:    General: Skin is warm and dry.  Neurological:     General: No focal deficit present.  Psychiatric:        Mood and Affect: Mood normal.        Behavior: Behavior normal.     Lab Results  Component Value Date   WBC 7.2 06/22/2018   HGB 14.9 06/22/2018   HCT 42.5 06/22/2018   PLT 165.0 06/22/2018   GLUCOSE 99 06/22/2018   CHOL 122 06/22/2018   TRIG 108.0 06/22/2018   HDL 41.70 06/22/2018   LDLCALC 59 06/22/2018   ALT 18 06/22/2018   AST 19 06/22/2018   NA 138 06/22/2018   K 3.9 06/22/2018   CL 106 06/22/2018   CREATININE 1.14 06/22/2018   BUN 21 06/22/2018   CO2 26 06/22/2018   TSH 3.44 06/22/2018   PSA 0.77 06/22/2018   INR 1.07 12/16/2016   HGBA1C 5.9 06/22/2018    No results found.  Assessment & Plan:   Vane was seen today for annual exam, hypertension, hyperlipidemia and coronary artery disease.  Diagnoses and all orders for this visit:  Dyslipidemia, goal LDL below 100- He has achieved his LDL goal and is doing well on the statin. -     Comprehensive metabolic panel; Future -     Lipid panel; Future -     TSH; Future  Hyperglycemia- His A1c is up to 5.9%.  He has very mild prediabetes.  Medical therapy is not indicated.  He agrees to improve his lifestyle modifications. -     Comprehensive metabolic panel; Future -     Hemoglobin A1c; Future  Routine general medical examination at a health care facility- Exam completed, labs reviewed, vaccines reviewed, he is referred for colon cancer screening, patient education material was given.  Hypercoagulable state, primary (HCC)- Will cont the DOAC.  Essential hypertension- His blood pressure is adequately well controlled.  Electrolytes and renal function are normal. -     CBC with Differential/Platelet; Future -     Comprehensive metabolic panel; Future -     TSH; Future -     Urinalysis, Routine w reflex microscopic; Future  Benign prostatic hyperplasia  without lower urinary tract symptoms- His PSA is low which is reassuring that he does not have prostate cancer.  He has no symptoms that need to be treated. -     PSA; Future  Occult blood in stools -     Ambulatory referral to Gastroenterology  Hypogonadism male -     Testosterone Total,Free,Bio, Males; Future   I have changed Tinnie Gens A. Gaughan's Xarelto to rivaroxaban. I am also having him maintain his allopurinol, metoprolol succinate,  rosuvastatin, and clomiPHENE.  Meds ordered this encounter  Medications  . rivaroxaban (XARELTO) 20 MG TABS tablet    Sig: TAKE 1 TABLET(20 MG) BY MOUTH DAILY WITH SUPPER    Dispense:  90 tablet    Refill:  1     Follow-up: Return in about 6 months (around 12/23/2018).  Sanda Linger, MD

## 2018-06-23 ENCOUNTER — Encounter: Payer: Self-pay | Admitting: Internal Medicine

## 2018-06-23 LAB — TESTOSTERONE TOTAL,FREE,BIO, MALES
Albumin: 4 g/dL (ref 3.6–5.1)
Sex Hormone Binding: 50 nmol/L (ref 22–77)
Testosterone, Bioavailable: 149.4 ng/dL (ref >=110.0)
Testosterone, Free: 81.2 pg/mL (ref 46.0–224.0)
Testosterone: 783 ng/dL (ref 250–827)

## 2018-06-23 MED ORDER — RIVAROXABAN 20 MG PO TABS: ORAL_TABLET | ORAL | 1 refills | Status: DC

## 2018-06-24 ENCOUNTER — Other Ambulatory Visit: Payer: Self-pay | Admitting: Internal Medicine

## 2018-06-24 DIAGNOSIS — E785 Hyperlipidemia, unspecified: Secondary | ICD-10-CM

## 2018-06-24 DIAGNOSIS — I2510 Atherosclerotic heart disease of native coronary artery without angina pectoris: Secondary | ICD-10-CM

## 2018-06-24 DIAGNOSIS — E291 Testicular hypofunction: Secondary | ICD-10-CM

## 2018-06-24 DIAGNOSIS — I10 Essential (primary) hypertension: Secondary | ICD-10-CM

## 2018-06-24 MED ORDER — CLOMIPHENE CITRATE 50 MG PO TABS: 50.0000 mg | ORAL_TABLET | Freq: Every day | ORAL | 1 refills | Status: DC

## 2018-06-24 MED ORDER — ROSUVASTATIN CALCIUM 10 MG PO TABS: 10.0000 mg | ORAL_TABLET | Freq: Every day | ORAL | 1 refills | Status: DC

## 2018-06-28 ENCOUNTER — Other Ambulatory Visit: Payer: Self-pay | Admitting: Internal Medicine

## 2018-06-28 ENCOUNTER — Telehealth: Payer: Self-pay | Admitting: Internal Medicine

## 2018-06-28 DIAGNOSIS — B009 Herpesviral infection, unspecified: Secondary | ICD-10-CM

## 2018-06-28 MED ORDER — VALACYCLOVIR HCL 1 G PO TABS: 1000.0000 mg | ORAL_TABLET | Freq: Two times a day (BID) | ORAL | 2 refills | Status: AC

## 2018-06-28 NOTE — Telephone Encounter (Signed)
I called pt- he states he uses Valtrex prn.

## 2018-06-28 NOTE — Telephone Encounter (Signed)
Copied from CRM [id number]. Topic: Quick Communication - Rx Refill/Question >> Jun 28, 2018  9:02 AM Deborha Payment wrote: Medication: valACYclovir (VALTREX) 1000 MG tablet   Has the patient contacted their pharmacy? No refills.  Preferred Pharmacy (with phone number or street name): [id number] - [place], [place] - [address] AT [facility] [address] & [facility] [address] [phone] (Phone) [phone] (Fax)    Agent: Please be advised that RX refills may take up to 3 business days. We ask that you follow-up with your pharmacy.

## 2018-06-28 NOTE — Telephone Encounter (Signed)
Does he take this for the occasional outbreak or daily to suppress outbreaks?  TJ

## 2018-06-28 NOTE — Telephone Encounter (Signed)
You have never rx'd Valtrex 1000 mg before. Ok to Rf?

## 2018-06-29 DIAGNOSIS — M25562 Pain in left knee: Secondary | ICD-10-CM | POA: Diagnosis not present

## 2018-07-07 DIAGNOSIS — R195 Other fecal abnormalities: Secondary | ICD-10-CM | POA: Diagnosis not present

## 2018-07-08 DIAGNOSIS — M25562 Pain in left knee: Secondary | ICD-10-CM | POA: Diagnosis not present

## 2018-07-08 DIAGNOSIS — M1712 Unilateral primary osteoarthritis, left knee: Secondary | ICD-10-CM | POA: Diagnosis not present

## 2018-07-08 DIAGNOSIS — S83242D Other tear of medial meniscus, current injury, left knee, subsequent encounter: Secondary | ICD-10-CM | POA: Diagnosis not present

## 2018-07-15 ENCOUNTER — Other Ambulatory Visit: Payer: Self-pay | Admitting: Internal Medicine

## 2018-08-16 DIAGNOSIS — M94262 Chondromalacia, left knee: Secondary | ICD-10-CM | POA: Diagnosis not present

## 2018-08-16 DIAGNOSIS — M23322 Other meniscus derangements, posterior horn of medial meniscus, left knee: Secondary | ICD-10-CM | POA: Diagnosis not present

## 2018-08-16 DIAGNOSIS — M2242 Chondromalacia patellae, left knee: Secondary | ICD-10-CM | POA: Diagnosis not present

## 2018-09-20 ENCOUNTER — Other Ambulatory Visit: Payer: Self-pay | Admitting: Internal Medicine

## 2018-09-20 DIAGNOSIS — I2510 Atherosclerotic heart disease of native coronary artery without angina pectoris: Secondary | ICD-10-CM

## 2018-09-20 DIAGNOSIS — E785 Hyperlipidemia, unspecified: Secondary | ICD-10-CM

## 2018-09-20 DIAGNOSIS — I10 Essential (primary) hypertension: Secondary | ICD-10-CM

## 2018-09-20 MED ORDER — METOPROLOL SUCCINATE ER 25 MG PO TB24: 25.0000 mg | ORAL_TABLET | Freq: Every day | ORAL | 0 refills | Status: DC

## 2018-09-20 MED ORDER — ROSUVASTATIN CALCIUM 10 MG PO TABS: 10.0000 mg | ORAL_TABLET | Freq: Every day | ORAL | 1 refills | Status: DC

## 2018-10-06 DIAGNOSIS — Z1159 Encounter for screening for other viral diseases: Secondary | ICD-10-CM | POA: Diagnosis not present

## 2018-10-11 DIAGNOSIS — Z86010 Personal history of colonic polyps: Secondary | ICD-10-CM | POA: Diagnosis not present

## 2018-10-11 DIAGNOSIS — D120 Benign neoplasm of cecum: Secondary | ICD-10-CM | POA: Diagnosis not present

## 2018-10-18 DIAGNOSIS — M1712 Unilateral primary osteoarthritis, left knee: Secondary | ICD-10-CM | POA: Diagnosis not present

## 2018-11-18 IMAGING — NM NM MISC PROCEDURE
3 series · 18 of 18 positions shown · non-contrast
Comparison: none

[Series 1: stress-gsp_(id)_sa · 6.4mm · 6.40mm/px · 6 of 512 frames shown]
[frame 43/512]
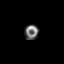
[frame 128/512]
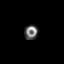
[frame 214/512]
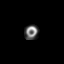
[frame 299/512]
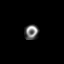
[frame 384/512]
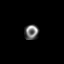
[frame 470/512]
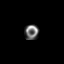

[Series 1: rest_(id)_sa · 6.4mm · 6.40mm/px · 6 of 64 frames shown]
[frame 6/64]
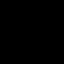
[frame 16/64]
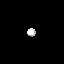
[frame 27/64]
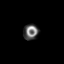
[frame 38/64]
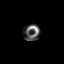
[frame 48/64]
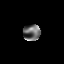
[frame 59/64]
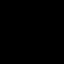

[Series 1: stress-sum-em_(id)_sa · 6.4mm · 6.40mm/px · 6 of 64 frames shown]
[frame 6/64]
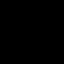
[frame 16/64]
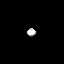
[frame 27/64]
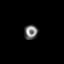
[frame 38/64]
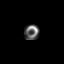
[frame 48/64]
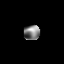
[frame 59/64]
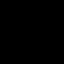

[18 of 18 positions shown; findings below may reference images not displayed]

Canned report from images found in remote index.

Refer to host system for actual result text.

## 2018-12-19 ENCOUNTER — Other Ambulatory Visit: Payer: Self-pay | Admitting: Internal Medicine

## 2018-12-19 DIAGNOSIS — Z20828 Contact with and (suspected) exposure to other viral communicable diseases: Secondary | ICD-10-CM | POA: Diagnosis not present

## 2018-12-19 DIAGNOSIS — D6859 Other primary thrombophilia: Secondary | ICD-10-CM

## 2018-12-19 DIAGNOSIS — I2699 Other pulmonary embolism without acute cor pulmonale: Secondary | ICD-10-CM

## 2018-12-19 MED ORDER — RIVAROXABAN 20 MG PO TABS: ORAL_TABLET | ORAL | 1 refills | Status: AC

## 2018-12-20 ENCOUNTER — Other Ambulatory Visit: Payer: Self-pay | Admitting: Internal Medicine

## 2018-12-20 DIAGNOSIS — E291 Testicular hypofunction: Secondary | ICD-10-CM

## 2018-12-20 MED ORDER — CLOMIPHENE CITRATE 50 MG PO TABS: 50.0000 mg | ORAL_TABLET | Freq: Every day | ORAL | 1 refills | Status: AC

## 2018-12-26 ENCOUNTER — Other Ambulatory Visit: Payer: Self-pay | Admitting: Internal Medicine

## 2018-12-26 DIAGNOSIS — I10 Essential (primary) hypertension: Secondary | ICD-10-CM

## 2018-12-26 NOTE — Telephone Encounter (Signed)
metoprolol succinate (TOPROL-XL) 25 MG 24 hr tablet  [id number] - [place], [place] - [phone] ([phone] (Fax)   This should have been filled with the other request from earlier.  Make sure send to [facility]!

## 2018-12-27 MED ORDER — METOPROLOL SUCCINATE ER 25 MG PO TB24: 25.0000 mg | ORAL_TABLET | Freq: Every day | ORAL | 1 refills | Status: AC

## 2018-12-27 NOTE — Telephone Encounter (Signed)
Requested medication (s) are due for refill today: yes  Requested medication (s) are on the active medication list: yes  Last refill:  09/20/2018  Future visit scheduled: no  Notes to clinic:  Overdue for office visit  Review for refill   Requested Prescriptions  Pending Prescriptions Disp Refills   metoprolol succinate (TOPROL-XL) 25 MG 24 hr tablet 90 tablet 0    Sig: Take 1 tablet (25 mg total) by mouth daily.     Cardiovascular:  Beta Blockers Failed - 12/27/2018  7:27 AM      Failed - Valid encounter within last 6 months    Recent Outpatient Visits          6 months ago Dyslipidemia, goal LDL below [place], MD   11 months ago Hyperglycemia   [place], Thomas L, MD   1 year ago Essential hypertension   [place], Thomas L, MD   2 years ago Essential hypertension   [place], Thomas L, MD   2 years ago Muscle spasm   [facility] at [facility], [place], [place] BP in normal range    BP Readings from Last 1 Encounters:  06/22/18 120/80         Passed - Last Heart Rate in normal range    Pulse Readings from Last 1 Encounters:  06/22/18 64

## 2018-12-27 NOTE — Telephone Encounter (Signed)
Wrong office

## 2019-03-23 ENCOUNTER — Other Ambulatory Visit: Payer: Self-pay | Admitting: Internal Medicine

## 2019-03-23 DIAGNOSIS — I2510 Atherosclerotic heart disease of native coronary artery without angina pectoris: Secondary | ICD-10-CM

## 2019-03-23 DIAGNOSIS — E785 Hyperlipidemia, unspecified: Secondary | ICD-10-CM

## 2019-03-24 MED ORDER — ROSUVASTATIN CALCIUM 10 MG PO TABS: 10.0000 mg | ORAL_TABLET | Freq: Every day | ORAL | 1 refills | Status: AC

## 2019-03-24 NOTE — Addendum Note (Signed)
Addended by: Verlan Friends on: 03/24/2019 05:31 AM   Modules accepted: Orders

## 2019-06-24 ENCOUNTER — Other Ambulatory Visit: Payer: Self-pay | Admitting: Internal Medicine

## 2019-06-24 DIAGNOSIS — D6859 Other primary thrombophilia: Secondary | ICD-10-CM

## 2019-06-24 DIAGNOSIS — I10 Essential (primary) hypertension: Secondary | ICD-10-CM

## 2019-06-24 DIAGNOSIS — E291 Testicular hypofunction: Secondary | ICD-10-CM

## 2019-09-20 ENCOUNTER — Other Ambulatory Visit: Payer: Self-pay | Admitting: Internal Medicine

## 2019-09-20 DIAGNOSIS — I2510 Atherosclerotic heart disease of native coronary artery without angina pectoris: Secondary | ICD-10-CM

## 2019-09-20 DIAGNOSIS — E785 Hyperlipidemia, unspecified: Secondary | ICD-10-CM

## 2020-05-20 ENCOUNTER — Other Ambulatory Visit: Payer: Self-pay | Admitting: Internal Medicine

## 2020-05-20 DIAGNOSIS — B009 Herpesviral infection, unspecified: Secondary | ICD-10-CM

## 2022-09-27 DEATH — deceased
